# Patient Record
Sex: Female | Born: 1945
Health system: Southern US, Community
[De-identification: ages and names within clinical notes are randomized; demographics above are authoritative.]

## PROBLEM LIST (undated history)

## (undated) DIAGNOSIS — M1990 Unspecified osteoarthritis, unspecified site: Secondary | ICD-10-CM

## (undated) DIAGNOSIS — J309 Allergic rhinitis, unspecified: Secondary | ICD-10-CM

## (undated) DIAGNOSIS — N6019 Diffuse cystic mastopathy of unspecified breast: Secondary | ICD-10-CM

## (undated) DIAGNOSIS — N200 Calculus of kidney: Secondary | ICD-10-CM

## (undated) DIAGNOSIS — H269 Unspecified cataract: Secondary | ICD-10-CM

## (undated) DIAGNOSIS — M8580 Other specified disorders of bone density and structure, unspecified site: Secondary | ICD-10-CM

## (undated) DIAGNOSIS — N870 Mild cervical dysplasia: Secondary | ICD-10-CM

## (undated) DIAGNOSIS — T7840XA Allergy, unspecified, initial encounter: Secondary | ICD-10-CM

## (undated) DIAGNOSIS — H9313 Tinnitus, bilateral: Secondary | ICD-10-CM

## (undated) DIAGNOSIS — Z8619 Personal history of other infectious and parasitic diseases: Secondary | ICD-10-CM

## (undated) DIAGNOSIS — I959 Hypotension, unspecified: Secondary | ICD-10-CM

## (undated) HISTORY — PX: CERVICAL CONE BIOPSY: SUR198

## (undated) HISTORY — PX: BREAST CYST ASPIRATION: SHX578

## (undated) HISTORY — DX: Allergy, unspecified, initial encounter: T78.40XA

## (undated) HISTORY — DX: Unspecified cataract: H26.9

## (undated) HISTORY — DX: Diffuse cystic mastopathy of unspecified breast: N60.19

## (undated) HISTORY — PX: BREAST CYST EXCISION: SHX579

## (undated) HISTORY — DX: Other specified disorders of bone density and structure, unspecified site: M85.80

## (undated) HISTORY — DX: Calculus of kidney: N20.0

## (undated) HISTORY — PX: BREAST BIOPSY: SHX20

## (undated) HISTORY — DX: Personal history of other infectious and parasitic diseases: Z86.19

## (undated) HISTORY — DX: Hypotension, unspecified: I95.9

## (undated) HISTORY — DX: Unspecified osteoarthritis, unspecified site: M19.90

## (undated) HISTORY — PX: COLPOSCOPY: SHX161

## (undated) HISTORY — PX: TUBAL LIGATION: SHX77

## (undated) HISTORY — PX: COLONOSCOPY: SHX174

## (undated) HISTORY — PX: LAPAROSCOPIC TUBAL LIGATION: SUR803

## (undated) HISTORY — DX: Mild cervical dysplasia: N87.0

## (undated) HISTORY — PX: OTHER SURGICAL HISTORY: SHX169

## (undated) HISTORY — DX: Tinnitus, bilateral: H93.13

## (undated) HISTORY — DX: Allergic rhinitis, unspecified: J30.9

---

## 1997-12-19 ENCOUNTER — Other Ambulatory Visit: Admission: RE | Admit: 1997-12-19 | Discharge: 1997-12-19 | Payer: Self-pay | Admitting: Obstetrics and Gynecology

## 1997-12-20 ENCOUNTER — Other Ambulatory Visit: Admission: RE | Admit: 1997-12-20 | Discharge: 1997-12-20 | Payer: Self-pay | Admitting: Obstetrics and Gynecology

## 1998-11-26 ENCOUNTER — Other Ambulatory Visit: Admission: RE | Admit: 1998-11-26 | Discharge: 1998-11-26 | Payer: Self-pay | Admitting: Obstetrics and Gynecology

## 1999-01-09 ENCOUNTER — Other Ambulatory Visit: Admission: RE | Admit: 1999-01-09 | Discharge: 1999-01-09 | Payer: Self-pay | Admitting: Obstetrics and Gynecology

## 1999-01-09 ENCOUNTER — Encounter (INDEPENDENT_AMBULATORY_CARE_PROVIDER_SITE_OTHER): Payer: Self-pay

## 1999-08-03 ENCOUNTER — Encounter: Payer: Self-pay | Admitting: Obstetrics and Gynecology

## 1999-08-03 ENCOUNTER — Encounter: Admission: RE | Admit: 1999-08-03 | Discharge: 1999-08-03 | Payer: Self-pay | Admitting: Obstetrics and Gynecology

## 1999-11-23 ENCOUNTER — Other Ambulatory Visit: Admission: RE | Admit: 1999-11-23 | Discharge: 1999-11-23 | Payer: Self-pay | Admitting: Obstetrics and Gynecology

## 1999-12-22 ENCOUNTER — Other Ambulatory Visit: Admission: RE | Admit: 1999-12-22 | Discharge: 1999-12-22 | Payer: Self-pay | Admitting: Obstetrics and Gynecology

## 2000-08-04 ENCOUNTER — Encounter: Payer: Self-pay | Admitting: Internal Medicine

## 2000-08-04 LAB — HM COLONOSCOPY

## 2000-08-08 ENCOUNTER — Encounter: Payer: Self-pay | Admitting: Obstetrics and Gynecology

## 2000-08-08 ENCOUNTER — Encounter: Admission: RE | Admit: 2000-08-08 | Discharge: 2000-08-08 | Payer: Self-pay | Admitting: Obstetrics and Gynecology

## 2000-11-24 ENCOUNTER — Other Ambulatory Visit: Admission: RE | Admit: 2000-11-24 | Discharge: 2000-11-24 | Payer: Self-pay | Admitting: Obstetrics and Gynecology

## 2000-12-01 ENCOUNTER — Encounter: Payer: Self-pay | Admitting: Obstetrics and Gynecology

## 2000-12-01 ENCOUNTER — Encounter: Admission: RE | Admit: 2000-12-01 | Discharge: 2000-12-01 | Payer: Self-pay | Admitting: Obstetrics and Gynecology

## 2001-10-16 ENCOUNTER — Encounter: Payer: Self-pay | Admitting: Obstetrics and Gynecology

## 2001-10-16 ENCOUNTER — Encounter: Admission: RE | Admit: 2001-10-16 | Discharge: 2001-10-16 | Payer: Self-pay | Admitting: Obstetrics and Gynecology

## 2001-11-29 ENCOUNTER — Other Ambulatory Visit: Admission: RE | Admit: 2001-11-29 | Discharge: 2001-11-29 | Payer: Self-pay | Admitting: Obstetrics and Gynecology

## 2002-10-18 ENCOUNTER — Encounter: Payer: Self-pay | Admitting: Obstetrics and Gynecology

## 2002-10-18 ENCOUNTER — Encounter: Admission: RE | Admit: 2002-10-18 | Discharge: 2002-10-18 | Payer: Self-pay | Admitting: Obstetrics and Gynecology

## 2003-01-09 ENCOUNTER — Other Ambulatory Visit: Admission: RE | Admit: 2003-01-09 | Discharge: 2003-01-09 | Payer: Self-pay | Admitting: Obstetrics and Gynecology

## 2003-02-28 ENCOUNTER — Encounter: Admission: RE | Admit: 2003-02-28 | Discharge: 2003-02-28 | Payer: Self-pay | Admitting: Obstetrics and Gynecology

## 2003-10-30 ENCOUNTER — Ambulatory Visit (HOSPITAL_COMMUNITY): Admission: RE | Admit: 2003-10-30 | Discharge: 2003-10-30 | Payer: Self-pay | Admitting: Obstetrics and Gynecology

## 2004-01-14 ENCOUNTER — Other Ambulatory Visit: Admission: RE | Admit: 2004-01-14 | Discharge: 2004-01-14 | Payer: Self-pay | Admitting: Obstetrics and Gynecology

## 2004-11-02 ENCOUNTER — Ambulatory Visit (HOSPITAL_COMMUNITY): Admission: RE | Admit: 2004-11-02 | Discharge: 2004-11-02 | Payer: Self-pay | Admitting: Obstetrics and Gynecology

## 2004-11-09 ENCOUNTER — Encounter: Admission: RE | Admit: 2004-11-09 | Discharge: 2004-11-09 | Payer: Self-pay | Admitting: Obstetrics and Gynecology

## 2005-01-19 ENCOUNTER — Other Ambulatory Visit: Admission: RE | Admit: 2005-01-19 | Discharge: 2005-01-19 | Payer: Self-pay | Admitting: Obstetrics and Gynecology

## 2005-03-23 ENCOUNTER — Ambulatory Visit (HOSPITAL_BASED_OUTPATIENT_CLINIC_OR_DEPARTMENT_OTHER): Admission: RE | Admit: 2005-03-23 | Discharge: 2005-03-23 | Payer: Self-pay | Admitting: Orthopedic Surgery

## 2005-11-04 ENCOUNTER — Ambulatory Visit (HOSPITAL_COMMUNITY): Admission: RE | Admit: 2005-11-04 | Discharge: 2005-11-04 | Payer: Self-pay | Admitting: Obstetrics and Gynecology

## 2006-01-20 ENCOUNTER — Other Ambulatory Visit: Admission: RE | Admit: 2006-01-20 | Discharge: 2006-01-20 | Payer: Self-pay | Admitting: Obstetrics and Gynecology

## 2006-11-15 ENCOUNTER — Ambulatory Visit (HOSPITAL_COMMUNITY): Admission: RE | Admit: 2006-11-15 | Discharge: 2006-11-15 | Payer: Self-pay | Admitting: Obstetrics and Gynecology

## 2007-01-24 ENCOUNTER — Other Ambulatory Visit: Admission: RE | Admit: 2007-01-24 | Discharge: 2007-01-24 | Payer: Self-pay | Admitting: Obstetrics and Gynecology

## 2007-01-24 LAB — CONVERTED CEMR LAB

## 2007-03-06 ENCOUNTER — Encounter: Payer: Self-pay | Admitting: Internal Medicine

## 2007-03-08 ENCOUNTER — Encounter: Payer: Self-pay | Admitting: Internal Medicine

## 2007-08-07 ENCOUNTER — Telehealth: Payer: Self-pay | Admitting: Internal Medicine

## 2007-11-07 ENCOUNTER — Ambulatory Visit: Payer: Self-pay | Admitting: Internal Medicine

## 2007-11-07 DIAGNOSIS — N6019 Diffuse cystic mastopathy of unspecified breast: Secondary | ICD-10-CM

## 2007-11-07 DIAGNOSIS — R87619 Unspecified abnormal cytological findings in specimens from cervix uteri: Secondary | ICD-10-CM | POA: Insufficient documentation

## 2007-11-07 DIAGNOSIS — J309 Allergic rhinitis, unspecified: Secondary | ICD-10-CM | POA: Insufficient documentation

## 2007-11-28 ENCOUNTER — Ambulatory Visit (HOSPITAL_COMMUNITY): Admission: RE | Admit: 2007-11-28 | Discharge: 2007-11-28 | Payer: Self-pay | Admitting: Obstetrics and Gynecology

## 2007-12-05 ENCOUNTER — Encounter: Admission: RE | Admit: 2007-12-05 | Discharge: 2007-12-05 | Payer: Self-pay | Admitting: Obstetrics and Gynecology

## 2008-01-26 ENCOUNTER — Encounter: Payer: Self-pay | Admitting: Obstetrics and Gynecology

## 2008-01-26 ENCOUNTER — Ambulatory Visit: Payer: Self-pay | Admitting: Obstetrics and Gynecology

## 2008-01-26 ENCOUNTER — Other Ambulatory Visit: Admission: RE | Admit: 2008-01-26 | Discharge: 2008-01-26 | Payer: Self-pay | Admitting: Obstetrics and Gynecology

## 2008-08-21 ENCOUNTER — Encounter: Payer: Self-pay | Admitting: Internal Medicine

## 2008-12-05 ENCOUNTER — Ambulatory Visit (HOSPITAL_COMMUNITY): Admission: RE | Admit: 2008-12-05 | Discharge: 2008-12-05 | Payer: Self-pay | Admitting: Obstetrics and Gynecology

## 2008-12-12 ENCOUNTER — Encounter: Admission: RE | Admit: 2008-12-12 | Discharge: 2008-12-12 | Payer: Self-pay | Admitting: Obstetrics and Gynecology

## 2009-01-28 ENCOUNTER — Ambulatory Visit: Payer: Self-pay | Admitting: Obstetrics and Gynecology

## 2009-01-28 ENCOUNTER — Other Ambulatory Visit: Admission: RE | Admit: 2009-01-28 | Discharge: 2009-01-28 | Payer: Self-pay | Admitting: Obstetrics and Gynecology

## 2009-03-11 ENCOUNTER — Ambulatory Visit: Payer: Self-pay | Admitting: Obstetrics and Gynecology

## 2009-09-01 ENCOUNTER — Ambulatory Visit: Payer: Self-pay | Admitting: Internal Medicine

## 2009-12-15 ENCOUNTER — Ambulatory Visit (HOSPITAL_COMMUNITY): Admission: RE | Admit: 2009-12-15 | Discharge: 2009-12-15 | Payer: Self-pay | Admitting: Obstetrics and Gynecology

## 2009-12-15 LAB — HM MAMMOGRAPHY: HM Mammogram: NEGATIVE

## 2010-01-30 ENCOUNTER — Other Ambulatory Visit
Admission: RE | Admit: 2010-01-30 | Discharge: 2010-01-30 | Payer: Self-pay | Source: Home / Self Care | Admitting: Obstetrics and Gynecology

## 2010-01-30 ENCOUNTER — Ambulatory Visit: Payer: Self-pay | Admitting: Obstetrics and Gynecology

## 2010-03-14 ENCOUNTER — Encounter: Payer: Self-pay | Admitting: Obstetrics and Gynecology

## 2010-03-24 ENCOUNTER — Telehealth: Payer: Self-pay | Admitting: Internal Medicine

## 2010-03-26 NOTE — Assessment & Plan Note (Signed)
Summary: [name] .Cynthia KitchenMarland Miller   Vital Signs:  Patient profile:   65 year old female Height:      62.5 inches Weight:      121.75 pounds BMI:     21.99 O2 Sat:      97 % on Room air Temp:     98.3 degrees F oral Pulse rate:   84 / minute Pulse rhythm:   regular BP sitting:   108 / 70  (left arm) Cuff size:   regular  Vitals Entered By: Rock Nephew CMA (September 01, 2009 1:25 PM)  O2 Flow:  Room air  Primary Care Provider:  Laelah Miller   History of Present Illness: Patient presents requesting zostavax. She was last seen in '09. she reports that she is healthy and doing well. She is current with her gynecologist.  Current Medications (verified): 1)  Boniva 150 Mg Tabs (Ibandronate Sodium) .... Take 1 Tablet By Mouth Once A Month 2)  Caltrate 600+d 600-400 Mg-Unit Tabs (Calcium Carbonate-Vitamin D) .... Take 1 Tablet By Mouth Two Times A Day 3)  Multivitamins   Tabs (Multiple Vitamin) .... Take 1 Tablet By Mouth Once A Day 4)  Vitamin B-12 1000 Mcg Tabs (Cyanocobalamin) .... Take 1 Tablet By Mouth Once A Day 5)  Claritin-D 24 Hour 10-240 Mg Xr24h-Tab (Loratadine-Pseudoephedrine) .... Take 1 Tablet By Mouth Once A Day  Allergies (verified): 1)  ! Pcn  Past History:  Past Medical History: Last updated: December 03, 2007 UCD ALLERGIC RHINITIS (ICD-477.9) FIBROCYSTIC BREAST DISEASE (ICD-610.1) Hx of PAP SMEAR, ABNORMAL (ICD-795.00)    Physician Roster:              Gyn - Dr. Basilio Cairo - Dr. Lestine Box              allergy - Dr. Ennis Forts - Drs. 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  Risk Factors: Alcohol Use: <1 (12/03/07) Caffeine Use: 0 (12-03-2007) Exercise: yes (2007-12-03)  Risk Factors: Smoking Status: never (2007-12-03)  Review of Systems       has no complaints - did not do detailed review.  Physical Exam  General:  alert, well-developed, well-nourished, and well-hydrated.   Lungs:  normal respiratory effort.   Heart:  normal rate and regular rhythm.   Neurologic:  alert & oriented X3 and gait normal.   Skin:  color normal.   Psych:  normally interactive and good eye contact.     Impression & Recommendations:  Problem # 1:  Preventive Health Care (ICD-V70.0) presents for immunization. Has no complaints. Reports being current with Gyn. Last colonoscopy '02 will be due in 2012; last mammogram October '10. Had tetnus and pneumonia vaccine in '09. Zostavax today.  In summary - a patient who presents requesting shingles vaccine. Old records fully reviewed. She has no  complaints. Recommend general physical exam and full labs in 2012 (3 year interval). She will otherwise return as needed.  Complete Medication List: 1)  Boniva 150 Mg Tabs (Ibandronate sodium) .... Take 1 tablet by mouth once a month 2)  Caltrate 600+d 600-400 Mg-unit Tabs (Calcium carbonate-vitamin d) .... Take 1 tablet by mouth two times a day 3)  Multivitamins Tabs (Multiple vitamin) .... Take 1 tablet by mouth once a day 4)  Vitamin B-12 1000 Mcg Tabs (Cyanocobalamin) .... Take 1 tablet by mouth once a day 5)  Claritin-d 24 Hour 10-240 Mg Xr24h-tab (Loratadine-pseudoephedrine) .... Take 1 tablet by mouth once a day  Other Orders: Zoster (Shingles) Vaccine Live [id number]) Admin 1st Vaccine (98119)    Immunizations Administered:  Zostavax # 1:     Vaccine Type: Zostavax    Site: left deltoid    Mfr: Merck    Dose: 0.5 ml    Route: IM    Given by: Rock Nephew CMA    Exp. Date: 08/01/2010    Lot #: [id number]    VIS given: 12/04/04 given September 01, 2009.

## 2010-03-27 ENCOUNTER — Encounter: Payer: Self-pay | Admitting: Internal Medicine

## 2010-04-01 NOTE — Procedures (Signed)
Summary: colonoscopy  Patient Name: Cynthia, Miller MRN:  Procedure Procedures: Colonoscopy CPT: 03474.  Personnel: Endoscopist: Shaneka Efaw L. Juanda Chance, MD.  Referred By: Rande Brunt Eda Paschal, M.D.  Exam Location: Exam performed in Outpatient Clinic. Outpatient  Patient Consent: Procedure, Alternatives, Risks and Benefits discussed, consent obtained, from patient.  Indications Symptoms: screening.  Average Risk Screening Routine.  History  Current Medications: Patient is taking an non-steroidal medication.  Pre-Exam Physical: Performed Aug 04, 2000. Cardio-pulmonary exam, Rectal exam, HEENT exam , Abdominal exam, Extremity exam, Neurological exam, Mental status exam WNL.  Exam Exam: Extent of exam reached: Cecum, extent intended: Cecum.  Colon retroflexion not performed. Images taken. ASA Classification: I. Tolerance: excellent.  Monitoring: Pulse and BP monitoring, Oximetry used. Supplemental O2 given.  Colon Prep Used Golytely for colon prep. Prep results: excellent.  Sedation Meds: Patient assessed and found to be appropriate for moderate (conscious) sedation. Fentanyl 100 mcg. Versed 10 mg.  Findings NORMAL EXAM: Cecum.   Assessment Normal examination.  Comments: no polyps Events  Unplanned Interventions: No intervention was required.  Unplanned Events: There were no complications. Plans Patient Education: Patient given standard instructions for: Yearly hemoccult testing recommended. Patient instructed to get routine colonoscopy every 10 years.  Comments: high fiber diet Disposition: After procedure patient sent to recovery. After recovery patient sent home.  Scheduling/Referral: Follow-Up prn.    cc: Reuel Boom L. Eda Paschal, MD This report was created from the original endoscopy report, which was reviewed and signed by the above listed endoscopist.

## 2010-04-01 NOTE — Letter (Signed)
Summary: Office Visit Letter  [facility]  [address] [place], [place] [address]   Phone: [phone]  Fax: [phone]      March 27, 2010 MRN: [id number]   [facility] [address] [place], [place]  [address]   Dear Ms. Milbourne,   You have been scheduled for a new patient visit with Dr. Marina Goodell on 04/30/10 at 9:30 AM. Please, bring your insurance cards,any copays and a medication list with you to the appointment. Failure to cancel an appointment 24 hours prior to visit may result in a cancellation fee.    If you have any questions, concerns, or feel that this letter is in error, we would appreciate your call at [phone].   Sincerely,     [facility] [phone]

## 2010-04-01 NOTE — Progress Notes (Signed)
Summary: switch drs  Phone Note Call from Patient Call back at Home Phone [phone]   Caller: Patient Call For: Dr Juanda Chance Reason for Call: Talk to Nurse Summary of Call: Patient would like to switch from Dr Juanda Chance to Dr Marina Goodell because Dr Eda Paschal suggested Dr Marina Goodell to her and because she was told that Dr Juanda Chance might be retiring soon. Initial call taken by: Tawni Levy,  March 24, 2010 9:29 AM  Follow-up for Phone Call        Message left for patient to call back.Jesse Fall RN  March 24, 2010 1:12 PM Spoke with patient. She was in to see Dr. Eda Paschal recently and he told her her figured Dr. Juanda Chance would be retiring soon since her husband had retired. He told pt. he saw Dr. Marina Goodell and liked him. Patient wants to change to Dr. Marina Goodell. She states she is due for a colonoscopy in 07/2010 and would like to do it earlier if possible. Last colon 04/09/00- normal. Please, advise Follow-up by: Jesse Fall RN,  March 24, 2010 3:45 PM  Additional Follow-up for Phone Call Additional follow up Details #1::        That's interesting. She knows something I don't. Additional Follow-up by: Hart Carwin MD,  March 25, 2010 2:26 PM    Additional Follow-up for Phone Call Additional follow up Details #2::    Message left for patient to call back.Jesse Fall RN  March 26, 2010 3:10 PM Patient does want to pursue changing to Dr. Marina Goodell. Is this ok? Follow-up by: Jesse Fall RN,  March 27, 2010 9:50 AM  Additional Follow-up for Phone Call Additional follow up Details #3:: Details for Additional Follow-up Action Taken: It's totally up to Dr Juanda Chance. I don't care one way or the other. Hilarie Fredrickson MD  March 27, 2010 2:15 PM      OK. Additional Follow-up by: Hart Carwin MD,  March 27, 2010 1:50 PM   Appended Document: switch drs Scheduled patient for new patient visit with Dr. Marina Goodell on  04/30/10 at 9:30 AM. Left message with her family for her to call me.  Appended  Document: switch drs Spoke with patient and gave her the appointment. Also, mailed her a letter with appt. time.

## 2010-04-30 ENCOUNTER — Encounter: Payer: Self-pay | Admitting: Internal Medicine

## 2010-04-30 ENCOUNTER — Ambulatory Visit (INDEPENDENT_AMBULATORY_CARE_PROVIDER_SITE_OTHER): Payer: Medicare Other | Admitting: Internal Medicine

## 2010-04-30 DIAGNOSIS — Z1211 Encounter for screening for malignant neoplasm of colon: Secondary | ICD-10-CM

## 2010-05-04 ENCOUNTER — Ambulatory Visit (INDEPENDENT_AMBULATORY_CARE_PROVIDER_SITE_OTHER): Payer: Medicare Other | Admitting: Obstetrics and Gynecology

## 2010-05-04 DIAGNOSIS — R221 Localized swelling, mass and lump, neck: Secondary | ICD-10-CM

## 2010-05-05 ENCOUNTER — Other Ambulatory Visit: Payer: Self-pay | Admitting: Obstetrics and Gynecology

## 2010-05-05 DIAGNOSIS — R609 Edema, unspecified: Secondary | ICD-10-CM

## 2010-05-05 NOTE — Assessment & Plan Note (Signed)
Summary:  Screening colonoscopy   History of Present Illness Visit Type: Follow-up Visit Primary GI MD: Yancey Flemings MD Primary Provider: Jacques Navy, MD  Requesting Provider: na Chief Complaint: Consult colon. Pt denies any GI complaints  History of Present Illness:    healthy 65 year old female who presents today regarding routine colorectal neoplasia screening. She underwent index screening colonoscopy June 2002 with Dr. Juanda Chance. Examination was normal. Followup in 10 years recommended. Patient requested that I perform her followup exam. Mutually  agreed. Her GI review of systems is negative. No interval development of colon cancer in her family. No problems or concerns related to her initial procedure.   GI Review of Systems      Denies abdominal pain, acid reflux, belching, bloating, chest pain, dysphagia with liquids, dysphagia with solids, heartburn, loss of appetite, nausea, vomiting, vomiting blood, weight loss, and  weight gain.        Denies anal fissure, black tarry stools, change in bowel habit, constipation, diarrhea, diverticulosis, fecal incontinence, heme positive stool, hemorrhoids, irritable bowel syndrome, jaundice, light color stool, liver problems, rectal bleeding, and  rectal pain.    Current Medications (verified): 1)  Caltrate 600+d 600-400 Mg-Unit Tabs (Calcium Carbonate-Vitamin D) .... Take 1 Tablet By Mouth Two Times A Day 2)  One A Day Multi Vitamin Over Age 24 .... One Tablet By Mouth Once Daily 3)  Claritin-D 24 Hour 10-240 Mg Xr24h-Tab (Loratadine-Pseudoephedrine) .... Take 1 Tablet By Mouth Once A Day 4)  Omega-3 Fish Oil 1000 Mg Caps (Omega-3 Fatty Acids) .... Takes 1200mg  By Mouth Once Daily  Allergies (verified): 1)  ! Pcn  Past History:  Past Medical History: Reviewed history from 11/07/2007 and no changes required. UCD ALLERGIC RHINITIS (ICD-477.9) FIBROCYSTIC BREAST DISEASE (ICD-610.1) Hx of PAP SMEAR, ABNORMAL  (ICD-795.00)    Physician Roster:              Gyn - Dr. Basilio Cairo - Dr. Lestine Box              allergy - Dr. Ennis Forts - Drs. Virgina Norfolk  Past Surgical History: Reviewed history from 11/07/2007 and no changes required. breast biopsy '65 foot surgery - right 1st MCP for post-traumatic joint deformity '06 Lestine Box)  Family History: Reviewed history from 11/07/2007 and no changes required. Father - deceased @ 41s: Lung cancer (smoker) Mother - 10: Independent, lipids, thyroid disease Neg-breast or colon cancer; DM; CAD  Social History: Reviewed history from 11/07/2007 and no changes required. 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  Review of Systems       The patient complains of allergy/sinus.  The patient denies anemia, anxiety-new, arthritis/joint pain, back pain, blood in urine, breast changes/lumps, change in vision, confusion, cough, coughing up blood, depression-new, fainting, fatigue, fever, headaches-new, hearing problems, heart murmur, heart rhythm changes, itching, menstrual pain, muscle pains/cramps, night sweats, nosebleeds, pregnancy symptoms, shortness of breath, skin rash, sleeping problems, sore throat, swelling of feet/legs, swollen lymph glands, thirst - excessive , urination - excessive , urination changes/pain, urine leakage, vision changes, and voice change.    Vital Signs:  Patient profile:   65 year old female Height:  62.5 inches Weight:      123 pounds BMI:     22.22 BSA:     1.57 Pulse rate:   88 / minute Pulse rhythm:   regular BP sitting:   118 / 64  (left arm) Cuff size:   regular  Vitals Entered By: Ok Anis CMA (April 30, 2010 9:40 AM)  Physical Exam  General:  Well developed, well nourished, no acute distress. Head:   Normocephalic and atraumatic. Eyes:  PERRLA, no icterus. Mouth:  No deformity or lesions, dentition normal. Neck:  Supple; no masses or thyromegaly. Lungs:  Clear throughout to auscultation. Heart:  Regular rate and rhythm; no murmurs, rubs,  or bruits. Abdomen:  Soft, nontender and nondistended. No masses, hepatosplenomegaly or hernias noted. Normal bowel sounds. Msk:  Symmetrical with no gross deformities. Normal posture. Pulses:  Normal pulses noted. Extremities:   no edema Neurologic:  Alert and  oriented x4; Skin:  Intact without significant lesions or rashes. Psych:  Alert and cooperative. Normal mood and affect.   Impression & Recommendations:  Problem # 1:  SCREENING, COLON CANCER (ICD-V1.87)  65 year old female with no significant past medical history. Presents today regarding screening colonoscopy. Negative index exam 10 years ago. she is an appropriate candidate without contraindication. the nature of the procedure as well as the risks, benefits, and alternatives were reviewed. she understood and agreed to proceed. movi prep to be prescribed. the patient will be instructed on this use  Patient Instructions: 1)  Call our office in 2 weeks and schedule your colonoscopy.  Our schedule for end of April and May should be available.  We will schedule you for your procedure and to come in for a pre-visit with our nurse to go over instructions with you.   2)  Copy sent to : Illene Regulus, MD  Edyth Gunnels, MD 3)  The medication list was reviewed and reconciled.  All changed / newly prescribed medications were explained.  A complete medication list was provided to the patient / caregiver.

## 2010-05-06 ENCOUNTER — Ambulatory Visit
Admission: RE | Admit: 2010-05-06 | Discharge: 2010-05-06 | Disposition: A | Discharge status: Home / Self Care | Payer: Medicare Other | Source: Ambulatory Visit | Attending: Obstetrics and Gynecology | Admitting: Obstetrics and Gynecology

## 2010-05-06 DIAGNOSIS — R609 Edema, unspecified: Secondary | ICD-10-CM

## 2010-07-10 NOTE — Op Note (Signed)
[name], Cynthia Miller               ACCOUNT NO.:  [id number]   MEDICAL RECORD NO.:  [id number]          PATIENT TYPE:  AMB   LOCATION:  [facility]                          FACILITY:  [facility]   PHYSICIAN:  Leonides Grills, M.D.     DATE OF BIRTH:  1945/09/01   DATE OF PROCEDURE:  03/23/2005  DATE OF DISCHARGE:                                 OPERATIVE REPORT   PREOPERATIVE DIAGNOSIS:  Right hallux rigidus.   POSTOPERATIVE DIAGNOSIS:  Right hallux rigidus.   OPERATION PERFORMED:  Right great toe cheilectomy.   SURGEON:  Leonides Grills, M.D.   ASSISTANT:  Lianne Cure, P.A.   ANESTHESIA:  General.   ESTIMATED BLOOD LOSS:  Minimal.   TOURNIQUET TIME:  Approximately half hour.   COMPLICATIONS:  None.   DISPOSITION:  Stable to PR.   INDICATIONS FOR PROCEDURE:  The patient is a 65 year old female who has had  longstanding dorsal right great toe pain that was interfering with her life  to the point where she cannot do what she wants to do. The patient  has  consented for the above procedure.  All risks which include infection,  neurovascular injury, persistent pain, worsening pain, prolonged recovery,  stiffness, arthritis and possible future fusion were all explained,  questions were encouraged and answered.   DESCRIPTION OF PROCEDURE:  The patient was brought to the operating room and  placed in supine position after adequate general endotracheal tube  anesthesia was administered with block as well as Ancef 1 g IV piggyback.  The right lower extremity was then prepped and draped in sterile manner over  a proximally placed thigh tourniquet.  The limb was gravity exsanguinated  and tourniquet was elevated to 290 mmHg.  A longitudinal incision over the  dorsal aspect of the left great toe MTP joint was then made.  Dissection was  carried out through the skin. Hemostasis was obtained.  A capsulotomy was  made just medial to the EHL tendon that was retracted out of harm's way and  protected  throughout the case.  There was a large dorsal osteophyte on the  dorsal aspect of the first metatarsal head.  This was carefully dissected  out both medially and laterally.  Then with a sagittal saw, this was then  removed, removing the top third to quarter of the left metatarsal head and  spur.  We then entered medial and lateral gutters and removed the  osteophytes as well as loose bodies in this area as well.  We then removed  the dorsal osteophyte on the base of the proximal phalanx as well with the  rongeur.  The toe was ranged and had outstanding range of motion with no  crepitus. The remaining cartilage looked excellent.  The joint and area were  copiously  irrigated with normal saline.  Bone wax was applied to bony surfaces.  Capsule was closed with 3-0 Vicryl.  Tourniquet was deflated, hemostasis was  obtained.  Capsule was closed with 4-0 nylon.  Sterile dressing was applied.  Hard sole shoe was applied.  The patient was stable to the PR.  Leonides Grills, M.D.  Electronically Signed     PB/MEDQ  D:  03/23/2005  T:  03/23/2005  Job:  [id number]

## 2010-07-13 ENCOUNTER — Ambulatory Visit (AMBULATORY_SURGERY_CENTER): Payer: Medicare Other | Admitting: *Deleted

## 2010-07-13 VITALS — Ht 63.0 in | Wt 124.3 lb

## 2010-07-13 DIAGNOSIS — Z1211 Encounter for screening for malignant neoplasm of colon: Secondary | ICD-10-CM

## 2010-07-13 MED ORDER — PEG-KCL-NACL-NASULF-NA ASC-C 100 G PO SOLR: ORAL | Status: DC

## 2010-07-22 ENCOUNTER — Encounter: Payer: Self-pay | Admitting: Internal Medicine

## 2010-07-23 ENCOUNTER — Ambulatory Visit (AMBULATORY_SURGERY_CENTER): Payer: Medicare Other | Admitting: Internal Medicine

## 2010-07-23 ENCOUNTER — Encounter: Payer: Self-pay | Admitting: Internal Medicine

## 2010-07-23 VITALS — BP 134/76 | HR 81 | Temp 97.4°F | Resp 19 | Ht 63.0 in | Wt 124.0 lb

## 2010-07-23 DIAGNOSIS — Z1211 Encounter for screening for malignant neoplasm of colon: Secondary | ICD-10-CM

## 2010-07-23 DIAGNOSIS — D126 Benign neoplasm of colon, unspecified: Secondary | ICD-10-CM

## 2010-07-23 MED ORDER — SODIUM CHLORIDE 0.9 % IV SOLN: 500.0000 mL | INTRAVENOUS | Status: DC

## 2010-07-23 NOTE — Patient Instructions (Addendum)
Green and blue discharge instructions reviewed with patient and care partner.  Blue discharge instructions signed by care partner.  Impressions/Recommendations;  Polyp (handout given)  Repeat colonoscopy in 5-10 years.  Continue medications as previously taken prior to procedure.

## 2010-07-23 NOTE — Progress Notes (Signed)
Pt pale and diaphoretic with mild nausea upon getting up and getting dressed. Laid back down bp 130/71, hr 69, cool cloth applied to neck and forehead. Pt abdomen still a bit distended, placed onto right lat decub and trendelenburg...with passage of some air. Dr. Marina Goodell in and aware.  1412: Pt color improved. Raising head of bed slowly and allowing pt to move about. 1415: Pt still very groggy. Moved to holding area with recliners to let medicine wear off a bit more.

## 2010-07-24 ENCOUNTER — Telehealth: Payer: Self-pay

## 2010-07-24 NOTE — Telephone Encounter (Signed)

## 2010-07-29 ENCOUNTER — Encounter: Payer: Self-pay | Admitting: Internal Medicine

## 2010-10-02 ENCOUNTER — Ambulatory Visit (INDEPENDENT_AMBULATORY_CARE_PROVIDER_SITE_OTHER): Payer: Medicare Other | Admitting: Internal Medicine

## 2010-10-02 ENCOUNTER — Encounter: Payer: Self-pay | Admitting: Internal Medicine

## 2010-10-02 VITALS — BP 110/72 | HR 82 | Temp 98.7°F | Ht 62.5 in

## 2010-10-02 DIAGNOSIS — J208 Acute bronchitis due to other specified organisms: Secondary | ICD-10-CM

## 2010-10-02 DIAGNOSIS — J209 Acute bronchitis, unspecified: Secondary | ICD-10-CM

## 2010-10-02 MED ORDER — CHLORPHENIRAMINE-HYDROCODONE 8-10 MG/5ML PO LQCR: 5.0000 mL | Freq: Two times a day (BID) | ORAL | Status: AC | PRN

## 2010-10-02 NOTE — Patient Instructions (Signed)
It was good to see you today. If you develop worsening symptoms or fever, call and we can reconsider antibiotics, but it does not appear necessary to use antibiotics at this time. Tussionex cough syrup as needed - Your prescription(s) have been submitted to your pharmacy. Please take as directed and contact our office if you believe you are having problem(s) with the medication(s). Can also try Mucinex DM for cough control

## 2010-10-02 NOTE — Progress Notes (Signed)
  Subjective:    Patient ID: Cynthia Miller, female    DOB: 04/08/1945, 65 y.o.   MRN: [id number]  HPI  complains of chest congestion Onset 1 week ago associated with cough, esp bedtime -> productive of thick yellow sputum initially, now improved Also associated with fatigue and hoarsness Denies fever, sore throat, headache or night sweats symptoms have gradually improved since onset Not using any OTC meds No sick contacts Taking claritin for allergy/sinus symptoms   Past Medical History  Diagnosis Date  . Low blood pressure   . Osteoarthritis     THUMB  . ALLERGIC RHINITIS   . FIBROCYSTIC BREAST DISEASE     Review of Systems  Constitutional: Positive for fatigue. Negative for fever, chills, diaphoresis and unexpected weight change.  HENT: Positive for postnasal drip. Negative for neck pain.   Respiratory: Negative for shortness of breath and wheezing.   Cardiovascular: Negative for leg swelling.  Neurological: Negative for headaches.       Objective:   Physical Exam BP 110/72  Pulse 82  Temp(Src) 98.7 F (37.1 C) (Oral)  Ht 5' 2.5" (1.588 m)  SpO2 94% Constitutional: She is appears well-developed and well-nourished but mildly ill - hoarse voice quality HENT: Head: Normocephalic and atraumatic. Ears:L TMs ok,R TM with clear serous effusion- no erythema; Nose: Nose normal.  Mouth/Throat: Oropharynx is moderately erythematous; +yellow PND. No oropharyngeal exudate.  Eyes: Conjunctivae and EOM are normal. Pupils are equal, round, and reactive to light. No scleral icterus.  Neck: Normal range of motion. Neck supple. No JVD present. No thyromegaly present.  Cardiovascular: Normal rate, regular rhythm and normal heart sounds.  No murmur heard. No BLE edema. Pulmonary/Chest: Effort normal and breath sounds normal. No respiratory distress. She has no wheezes.  Psychiatric: She has a normal mood and affect. Her behavior is normal. Judgment and thought content normal.   No  results found for this basename: WBC, HGB, HCT, PLT, CHOL, TRIG, HDL, LDLDIRECT, ALT, AST, NA, K, CL, CREATININE, BUN, CO2, TSH, PSA, INR, GLUF, HGBA1C, MICROALBUR      Assessment & Plan:  Acute bronchitis, suspect viral - gradually improving symptoms - no fever but persisting cough -  Will hold antibiotics at this time but pt will call if symptoms worse symptoms relief with tussionex (rx done) or OTC meds advised Continue claritin as ongoing

## 2010-10-05 ENCOUNTER — Telehealth: Payer: Self-pay

## 2010-10-05 MED ORDER — AZITHROMYCIN 250 MG PO TABS: ORAL_TABLET | ORAL | Status: DC

## 2010-10-05 NOTE — Telephone Encounter (Signed)
zpak done - thanks

## 2010-10-05 NOTE — Telephone Encounter (Signed)
Addended by: Rene Paci A on: 10/05/2010 11:50 AM   Modules accepted: Orders

## 2010-10-05 NOTE — Telephone Encounter (Signed)
Duplicate note - see addendum on prior note

## 2010-10-05 NOTE — Telephone Encounter (Signed)
Pt called stating URI sxs are worsening. Pt c/o low grade temperature, productive cough and body aches. Pt is requesting Rx for Z pak to pharmacy.

## 2010-10-05 NOTE — Telephone Encounter (Signed)
Pt informed

## 2010-10-05 NOTE — Telephone Encounter (Signed)
Pt called stating URI sxs are worsening, low grade temperature, with productive cough and body pain. Pt is requesting Rx for Z pak to pharmacy, please advise.

## 2010-10-14 ENCOUNTER — Telehealth: Payer: Self-pay

## 2010-10-14 MED ORDER — AZITHROMYCIN 250 MG PO TABS: ORAL_TABLET | ORAL | Status: AC

## 2010-10-14 NOTE — Telephone Encounter (Signed)
Ok - but if persisting symptoms after completion of 2nd course, should ROV for reeval of problem

## 2010-10-14 NOTE — Telephone Encounter (Signed)
Pt called stating she is still sick after completing ABX 5 days ago. Pt has also started antihistamine but still has congestions, mild ST and cough. Pt is requesting. Pt is requesting a refill of ABX, [employer].

## 2010-11-16 ENCOUNTER — Ambulatory Visit (INDEPENDENT_AMBULATORY_CARE_PROVIDER_SITE_OTHER): Payer: Medicare Other | Admitting: Sports Medicine

## 2010-11-16 VITALS — BP 112/70 | Ht 62.0 in | Wt 124.0 lb

## 2010-11-16 DIAGNOSIS — M2020 Hallux rigidus, unspecified foot: Secondary | ICD-10-CM

## 2010-11-16 DIAGNOSIS — M79609 Pain in unspecified limb: Secondary | ICD-10-CM

## 2010-11-16 DIAGNOSIS — M79643 Pain in unspecified hand: Secondary | ICD-10-CM

## 2010-11-16 DIAGNOSIS — M2170 Unequal limb length (acquired), unspecified site: Secondary | ICD-10-CM

## 2010-11-16 DIAGNOSIS — M25673 Stiffness of unspecified ankle, not elsewhere classified: Secondary | ICD-10-CM

## 2010-11-16 MED ORDER — KETOPROFEN POWD: Status: DC

## 2010-11-17 ENCOUNTER — Other Ambulatory Visit: Payer: Self-pay | Admitting: Obstetrics and Gynecology

## 2010-11-17 DIAGNOSIS — M2020 Hallux rigidus, unspecified foot: Secondary | ICD-10-CM | POA: Insufficient documentation

## 2010-11-17 DIAGNOSIS — Z1231 Encounter for screening mammogram for malignant neoplasm of breast: Secondary | ICD-10-CM

## 2010-11-17 DIAGNOSIS — M2170 Unequal limb length (acquired), unspecified site: Secondary | ICD-10-CM | POA: Insufficient documentation

## 2010-11-17 NOTE — Assessment & Plan Note (Signed)
Ketoprofen topical given.  Follow up PRN.

## 2010-11-17 NOTE — Progress Notes (Signed)
  Subjective:    Patient ID: Cynthia Miller, female    DOB: 12-26-1945, 65 y.o.   MRN: [id number]  HPI 65 y/o female is here for orthotics.  She has a pair of orthotics with her that were made 5 years ago when she had a right great toe fusion.  She walks 4-5 miles daily and gets pain at the end of these walks.  Otherwise she doesn't have foot pain.    She also has had pain for many years at her right [facility] and would like to know if there is a treatment for this.   Review of Systems     Objective:   Physical Exam  Right hand: Hand is not tender to palpation Squaring and tenderness at the right [facility]  Right Foot: Longitudinal arch is preserved Transverse arch is not There is a surgical scar on the great toe The great toe is rigid; 10-30 degrees The feet have no callus  Left Foot The longitudinal arch is preserved  The transverse arch is not The feel have callus on the 2nd and 3rd metatarsals and the outside edge of the 1st and 5th digit. The great toe is less rigid than the right 30-45 Great toe is deviated laterally with hypertrophy of the MCP joint  Legs: Right 88cm total with 47.5cm upper segment Left 88.5 cm total, with 47 cm upper segment  Gait: Walking appeared neutral  Patient was fitted for a : standard, cushioned, semi-rigid orthotic. The orthotic was heated and afterward the patient stood on the orthotic blank positioned on the orthotic stand. The patient was positioned in subtalar neutral position and 10 degrees of ankle dorsiflexion in a weight bearing stance. After completion of molding, a stable base was applied to the orthotic blank. The blank was ground to a stable position for weight bearing. Size: 7 Base: black suede Posting: 1st ray posting          Assessment & Plan:

## 2010-11-17 NOTE — Assessment & Plan Note (Signed)
This was not corrected at this visit as it was under 1cm.  If the patient becomes symptomatic we will correct this with a lift.

## 2010-11-17 NOTE — Assessment & Plan Note (Signed)
We are treating this with a custom orthotic with a first ray post bilaterally.  She will return if she continues to have pain.

## 2010-12-18 ENCOUNTER — Ambulatory Visit (HOSPITAL_COMMUNITY)
Admission: RE | Admit: 2010-12-18 | Discharge: 2010-12-18 | Disposition: A | Discharge status: Home / Self Care | Payer: Medicare Other | Source: Ambulatory Visit | Attending: Obstetrics and Gynecology | Admitting: Obstetrics and Gynecology

## 2010-12-18 DIAGNOSIS — Z1231 Encounter for screening mammogram for malignant neoplasm of breast: Secondary | ICD-10-CM

## 2011-01-21 ENCOUNTER — Encounter: Payer: Self-pay | Admitting: *Deleted

## 2011-01-21 DIAGNOSIS — I959 Hypotension, unspecified: Secondary | ICD-10-CM | POA: Insufficient documentation

## 2011-01-21 DIAGNOSIS — M8580 Other specified disorders of bone density and structure, unspecified site: Secondary | ICD-10-CM | POA: Insufficient documentation

## 2011-01-21 DIAGNOSIS — D4989 Neoplasm of unspecified behavior of other specified sites: Secondary | ICD-10-CM | POA: Insufficient documentation

## 2011-01-21 DIAGNOSIS — M1990 Unspecified osteoarthritis, unspecified site: Secondary | ICD-10-CM | POA: Insufficient documentation

## 2011-02-10 ENCOUNTER — Encounter: Payer: Medicare Other | Admitting: Obstetrics and Gynecology

## 2011-03-09 ENCOUNTER — Other Ambulatory Visit: Payer: Self-pay | Admitting: Obstetrics and Gynecology

## 2011-03-09 DIAGNOSIS — M8580 Other specified disorders of bone density and structure, unspecified site: Secondary | ICD-10-CM

## 2011-03-17 ENCOUNTER — Ambulatory Visit (INDEPENDENT_AMBULATORY_CARE_PROVIDER_SITE_OTHER): Payer: Medicare Other | Admitting: Obstetrics and Gynecology

## 2011-03-17 ENCOUNTER — Encounter: Payer: Self-pay | Admitting: Obstetrics and Gynecology

## 2011-03-17 ENCOUNTER — Other Ambulatory Visit: Payer: Self-pay | Admitting: Obstetrics and Gynecology

## 2011-03-17 VITALS — BP 118/74 | Ht 63.0 in | Wt 126.0 lb

## 2011-03-17 DIAGNOSIS — N952 Postmenopausal atrophic vaginitis: Secondary | ICD-10-CM | POA: Diagnosis not present

## 2011-03-17 DIAGNOSIS — R350 Frequency of micturition: Secondary | ICD-10-CM

## 2011-03-17 DIAGNOSIS — N951 Menopausal and female climacteric states: Secondary | ICD-10-CM

## 2011-03-17 DIAGNOSIS — M899 Disorder of bone, unspecified: Secondary | ICD-10-CM | POA: Diagnosis not present

## 2011-03-17 DIAGNOSIS — N870 Mild cervical dysplasia: Secondary | ICD-10-CM | POA: Insufficient documentation

## 2011-03-17 DIAGNOSIS — Z780 Asymptomatic menopausal state: Secondary | ICD-10-CM

## 2011-03-17 DIAGNOSIS — M8580 Other specified disorders of bone density and structure, unspecified site: Secondary | ICD-10-CM

## 2011-03-17 DIAGNOSIS — M949 Disorder of cartilage, unspecified: Secondary | ICD-10-CM

## 2011-03-17 LAB — URINALYSIS W MICROSCOPIC + REFLEX CULTURE
Bilirubin Urine: NEGATIVE
Crystals: NONE SEEN
Glucose, UA: NEGATIVE mg/dL
Ketones, ur: NEGATIVE mg/dL
Protein, ur: NEGATIVE mg/dL
RBC / HPF: NONE SEEN RBC/hpf (ref <3)
Specific Gravity, Urine: 1.03 (ref 1.005–1.030)
Urobilinogen, UA: 0.2 mg/dL (ref 0.0–1.0)

## 2011-03-17 NOTE — Patient Instructions (Signed)
Bone density tomorrow.

## 2011-03-17 NOTE — Progress Notes (Signed)
Patient came back to see me today for further followup. She no longer takes her HRT. She still has hot flashes but today are tolerable. They use a lubricant with intercourse due to vaginal dryness. She feels is adequate. She is having no vaginal bleeding. She is having no pelvic pain. She has nocturia x1-2 without urgency or incontinence. She has osteopenia. She started Fosamax in January of 05. She switched to [facility] after several years and has been on drug holiday for slightly over a year. We had given her samples of atelvia  last year but she elected not to continue. She had reflux with the other drugs. She is having a bone density tomorrow. She continues with calcium and vitamin D. She has had no fractures.  ROS: Pertinent positives above.12 system review done. Only other positive is osteoarthritis of her right hand.  Physical examination:   Kennon Portela present. HEENT within normal limits. Neck: Thyroid not large. No masses. Supraclavicular nodes: not enlarged. Breasts: Examined in both sitting midline position. No skin changes and no masses. Abdomen: Soft no guarding rebound or masses or hernia. Pelvic: External: Within normal limits. BUS: Within normal limits. Vaginal:within normal limits. Poor estrogen effect. No evidence of cystocele rectocele or enterocele. Cervix: clean. Uterus: Normal size and shape. Adnexa: No masses. Rectovaginal exam: Confirmatory and negative. Extremities: Within normal limits.  Assessment: #1. Menopausal symptoms #2. Atrophic vaginitis #3. Osteopenia #4. Nocturia  Plan: A detailed discussion on all the above had. For the moment no treatment indicated. We will decide about treatment of her osteopenia after review of her bone density.

## 2011-03-18 ENCOUNTER — Ambulatory Visit (INDEPENDENT_AMBULATORY_CARE_PROVIDER_SITE_OTHER): Payer: Medicare Other

## 2011-03-18 DIAGNOSIS — M949 Disorder of cartilage, unspecified: Secondary | ICD-10-CM

## 2011-03-18 DIAGNOSIS — M8580 Other specified disorders of bone density and structure, unspecified site: Secondary | ICD-10-CM

## 2011-03-18 DIAGNOSIS — M899 Disorder of bone, unspecified: Secondary | ICD-10-CM | POA: Diagnosis not present

## 2011-03-19 LAB — URINE CULTURE: Colony Count: NO GROWTH

## 2011-06-28 ENCOUNTER — Encounter: Payer: Self-pay | Admitting: Endocrinology

## 2011-06-28 ENCOUNTER — Ambulatory Visit (INDEPENDENT_AMBULATORY_CARE_PROVIDER_SITE_OTHER): Payer: Medicare Other | Admitting: Endocrinology

## 2011-06-28 VITALS — BP 122/78 | HR 70 | Temp 97.3°F | Ht 62.5 in | Wt 130.0 lb

## 2011-06-28 DIAGNOSIS — W57XXXA Bitten or stung by nonvenomous insect and other nonvenomous arthropods, initial encounter: Secondary | ICD-10-CM

## 2011-06-28 DIAGNOSIS — T148XXA Other injury of unspecified body region, initial encounter: Secondary | ICD-10-CM | POA: Diagnosis not present

## 2011-06-28 DIAGNOSIS — T148 Other injury of unspecified body region: Secondary | ICD-10-CM

## 2011-06-28 MED ORDER — HALOBETASOL PROPIONATE 0.05 % EX CREA: TOPICAL_CREAM | Freq: Three times a day (TID) | CUTANEOUS | Status: DC | PRN

## 2011-06-28 MED ORDER — DOXYCYCLINE HYCLATE 100 MG PO TABS: 100.0000 mg | ORAL_TABLET | Freq: Two times a day (BID) | ORAL | Status: AC

## 2011-06-28 NOTE — Progress Notes (Signed)
Subjective:    Patient ID: Cynthia Miller, female    DOB: Nov 22, 1945, 66 y.o.   MRN: [id number]  HPI Pt [place].  She does not recall being stung.  However, she has 1 week of slight rash at the left ankle, and assoc itching Past Medical History  Diagnosis Date  . Low blood pressure   . Osteoarthritis     THUMB  . ALLERGIC RHINITIS   . FIBROCYSTIC BREAST DISEASE   . Osteopenia   . CIN I (cervical intraepithelial neoplasia I)     Past Surgical History  Procedure Date  . Colonoscopy   . Right foot   . Breast cyst aspiration   . Laparoscopic tubal ligation   . Tubal ligation   . Colposcopy   . Cervical cone biopsy     History   Social History  . Marital Status: Married    Spouse Name: N/A    Number of Children: N/A  . Years of Education: N/A   Occupational History  . Not on file.   Social History Main Topics  . Smoking status: Never Smoker   . Smokeless tobacco: Not on file  . Alcohol Use: 8.4 oz/week    14 Glasses of wine per week  . Drug Use: No  . Sexually Active: Yes    Birth Control/ Protection: Surgical   Other Topics Concern  . Not on file   Social History Narrative  . No narrative on file    Current Outpatient Prescriptions on File Prior to Visit  Medication Sig Dispense Refill  . Calcium-Vitamin D (CALTRATE 600 PLUS-VIT D PO) Take 1 capsule by mouth daily.        Marland Petrakis loratadine-pseudoephedrine (CLARITIN-D 24-HOUR) 10-240 MG per 24 hr tablet Take 1 tablet by mouth daily.        . Multiple Vitamin (MULTIVITAMIN PO) Take 1 tablet by mouth daily.        . Omega-3 Fatty Acids (FISH OIL) 1200 MG CAPS Take 1 capsule by mouth daily.        . chlorpheniramine-hydrocodone (TUSSIONEX) 8-10 MG/5ML suspension Take 5 mLs by mouth every 12 (twelve) hours as needed for cough.  60 mL  0  . Ketoprofen POWD Apply to affected area four times per day as needed.  60 g  PRN   Current Facility-Administered Medications on File Prior  to Visit  Medication Dose Route Frequency Provider Last Rate Last Dose  . 0.9 %  sodium chloride infusion  500 mL Intravenous Continuous Hilarie Fredrickson, MD        Allergies  Allergen Reactions  . Penicillins     REACTION: Hives    Family History  Problem Relation Age of Onset  . Diverticulosis Father   . Lung cancer Father   . Colon cancer Neg Hx   . Hyperlipidemia Mother   . Thyroid disease Mother   . Breast cancer Paternal Grandmother     BP 122/78  Pulse 70  Temp(Src) 97.3 F (36.3 C) (Oral)  Ht 5' 2.5" (1.588 m)  Wt 130 lb (58.968 kg)  BMI 23.40 kg/m2  SpO2 99%  Review of Systems Denies fever, but she has slight pain there.      Objective:   Physical Exam VITAL SIGNS:  See vs page GENERAL: no distress Left leg, just proximal to the medial malleolus:  Insect sting would, with 1 cm rim of erythema.  There is also slight erythema a few  cm distal to the wound.      Assessment & Plan:  Insect bite, with early cellulitis.

## 2011-06-28 NOTE — Patient Instructions (Signed)
i have sent 2 prescriptions to your pharmacy: for an antibiotic, and anti-itch cream.   I hope you feel better soon.  If you don't feel better by next week, please call back.  Please call sooner if the rash gets worse.

## 2011-11-17 ENCOUNTER — Other Ambulatory Visit: Payer: Self-pay | Admitting: Obstetrics and Gynecology

## 2011-11-17 DIAGNOSIS — Z1231 Encounter for screening mammogram for malignant neoplasm of breast: Secondary | ICD-10-CM

## 2011-11-24 ENCOUNTER — Ambulatory Visit (INDEPENDENT_AMBULATORY_CARE_PROVIDER_SITE_OTHER): Payer: Medicare Other | Admitting: Internal Medicine

## 2011-11-24 ENCOUNTER — Encounter: Payer: Self-pay | Admitting: Internal Medicine

## 2011-11-24 VITALS — BP 120/78 | HR 74 | Temp 97.8°F | Ht 62.5 in | Wt 130.2 lb

## 2011-11-24 DIAGNOSIS — J069 Acute upper respiratory infection, unspecified: Secondary | ICD-10-CM | POA: Diagnosis not present

## 2011-11-24 DIAGNOSIS — Z23 Encounter for immunization: Secondary | ICD-10-CM | POA: Diagnosis not present

## 2011-11-24 MED ORDER — AZITHROMYCIN 250 MG PO TABS: ORAL_TABLET | ORAL | Status: DC

## 2011-11-24 NOTE — Progress Notes (Signed)
Subjective:    Patient ID: Cynthia Miller, female    DOB: December 08, 1945, 66 y.o.   MRN: [id number]  HPI Cynthia Miller presents with a week long h/o increased raspy throat, increased congestion but does drain due claritin D 24 hr. She has had a cough productive of sputum. No fever but she did have some chills. No SOB, fatigued. She has in the past seen Dr. Chalfant Callas for allergy evaluation.   Past Medical History  Diagnosis Date  . Low blood pressure   . Osteoarthritis     THUMB  . ALLERGIC RHINITIS   . FIBROCYSTIC BREAST DISEASE   . Osteopenia   . CIN I (cervical intraepithelial neoplasia I)    Past Surgical History  Procedure Date  . Colonoscopy   . Right foot   . Breast cyst aspiration   . Laparoscopic tubal ligation   . Tubal ligation   . Colposcopy   . Cervical cone biopsy    Family History  Problem Relation Age of Onset  . Diverticulosis Father   . Lung cancer Father   . Colon cancer Neg Hx   . Hyperlipidemia Mother   . Thyroid disease Mother   . Breast cancer Paternal Grandmother    History   Social History  . Marital Status: Married    Spouse Name: N/A    Number of Children: 1  . Years of Education: 16   Occupational History  . volunteer    Social History Main Topics  . Smoking status: Never Smoker   . Smokeless tobacco: Never Used  . Alcohol Use: 8.4 oz/week    14 Glasses of wine per week  . Drug Use: No  . Sexually Active: Yes -- Female partner(s)    Birth Control/ Protection: Surgical   Other Topics Concern  . Not on file   Social History Narrative   [employer], [employer] - [place] in [place] to finish BA Art history. Married 1969 - 10 yr/divorce. Remarried '83 - 10 yrs/divorced. Remarried '97 -. 1 dtr - '74. 2 grandchildren. Work - volunteering. Marriage in good health.     Current Outpatient Prescriptions on File Prior to Visit  Medication Sig Dispense Refill  . Calcium-Vitamin D (CALTRATE 600 PLUS-VIT D PO) Take 1 capsule by mouth daily.         Marland Severns loratadine-pseudoephedrine (CLARITIN-D 24-HOUR) 10-240 MG per 24 hr tablet Take 1 tablet by mouth daily.        . Multiple Vitamin (MULTIVITAMIN PO) Take 1 tablet by mouth daily.        . Omega-3 Fatty Acids (FISH OIL) 1200 MG CAPS Take 1 capsule by mouth daily.        . Ketoprofen POWD Apply to affected area four times per day as needed.  60 g  PRN   Current Facility-Administered Medications on File Prior to Visit  Medication Dose Route Frequency Provider Last Rate Last Dose  . 0.9 %  sodium chloride infusion  500 mL Intravenous Continuous Hilarie Fredrickson, MD          Review of Systems System review is negative for any constitutional, cardiac, pulmonary, GI or neuro symptoms or complaints other than as described in the HPI.     Objective:   Physical Exam Filed Vitals:   11/24/11 1625  BP: 120/78  Pulse: 74  Temp: 97.8 F (36.6 C)   Gen'l- WNWD white woman looking younger than her stated age HEENT_ mild tenderness over frontal sinus, TMs normal, Throat clear Cor-  RRR Pulm - normal respirations. CTAP       Assessment & Plan:  URI - Plan - Z-pak  Supportive care.

## 2011-11-24 NOTE — Patient Instructions (Addendum)
An upper respiratory infection - throat is clear. For full coverage of any upper respiratory infection will [place] a Z-Pak. Also, nasal wash, gargle of choice, vitamin C.  For influenza vaccine today.

## 2011-12-21 ENCOUNTER — Ambulatory Visit (HOSPITAL_COMMUNITY)
Admission: RE | Admit: 2011-12-21 | Discharge: 2011-12-21 | Disposition: A | Discharge status: Home / Self Care | Payer: Medicare Other | Source: Ambulatory Visit | Attending: Obstetrics and Gynecology | Admitting: Obstetrics and Gynecology

## 2011-12-21 DIAGNOSIS — Z1231 Encounter for screening mammogram for malignant neoplasm of breast: Secondary | ICD-10-CM | POA: Diagnosis not present

## 2012-01-13 DIAGNOSIS — Z411 Encounter for cosmetic surgery: Secondary | ICD-10-CM | POA: Diagnosis not present

## 2012-01-13 DIAGNOSIS — D239 Other benign neoplasm of skin, unspecified: Secondary | ICD-10-CM | POA: Diagnosis not present

## 2012-01-13 DIAGNOSIS — L570 Actinic keratosis: Secondary | ICD-10-CM | POA: Diagnosis not present

## 2012-03-01 DIAGNOSIS — H524 Presbyopia: Secondary | ICD-10-CM | POA: Diagnosis not present

## 2012-03-01 DIAGNOSIS — H2510 Age-related nuclear cataract, unspecified eye: Secondary | ICD-10-CM | POA: Diagnosis not present

## 2012-03-01 DIAGNOSIS — H52209 Unspecified astigmatism, unspecified eye: Secondary | ICD-10-CM | POA: Diagnosis not present

## 2012-03-28 DIAGNOSIS — L570 Actinic keratosis: Secondary | ICD-10-CM | POA: Diagnosis not present

## 2012-03-28 DIAGNOSIS — I781 Nevus, non-neoplastic: Secondary | ICD-10-CM | POA: Diagnosis not present

## 2012-11-27 ENCOUNTER — Other Ambulatory Visit: Payer: Self-pay | Admitting: Internal Medicine

## 2012-11-27 DIAGNOSIS — Z1231 Encounter for screening mammogram for malignant neoplasm of breast: Secondary | ICD-10-CM

## 2012-12-19 ENCOUNTER — Other Ambulatory Visit: Payer: Self-pay | Admitting: Dermatology

## 2012-12-19 ENCOUNTER — Encounter: Payer: Medicare Other | Admitting: Internal Medicine

## 2012-12-19 DIAGNOSIS — D239 Other benign neoplasm of skin, unspecified: Secondary | ICD-10-CM | POA: Diagnosis not present

## 2012-12-19 DIAGNOSIS — Z411 Encounter for cosmetic surgery: Secondary | ICD-10-CM | POA: Diagnosis not present

## 2012-12-19 DIAGNOSIS — D485 Neoplasm of uncertain behavior of skin: Secondary | ICD-10-CM | POA: Diagnosis not present

## 2012-12-19 DIAGNOSIS — T148 Other injury of unspecified body region: Secondary | ICD-10-CM | POA: Diagnosis not present

## 2012-12-25 ENCOUNTER — Ambulatory Visit (HOSPITAL_COMMUNITY)
Admission: RE | Admit: 2012-12-25 | Discharge: 2012-12-25 | Disposition: A | Discharge status: Home / Self Care | Payer: Medicare Other | Source: Ambulatory Visit | Attending: Internal Medicine | Admitting: Internal Medicine

## 2012-12-25 DIAGNOSIS — Z1231 Encounter for screening mammogram for malignant neoplasm of breast: Secondary | ICD-10-CM | POA: Insufficient documentation

## 2013-02-06 ENCOUNTER — Encounter: Payer: Self-pay | Admitting: Internal Medicine

## 2013-02-06 ENCOUNTER — Other Ambulatory Visit (INDEPENDENT_AMBULATORY_CARE_PROVIDER_SITE_OTHER): Payer: Medicare Other

## 2013-02-06 ENCOUNTER — Ambulatory Visit (INDEPENDENT_AMBULATORY_CARE_PROVIDER_SITE_OTHER): Payer: Medicare Other | Admitting: Internal Medicine

## 2013-02-06 VITALS — BP 110/70 | HR 83 | Temp 96.8°F | Resp 20 | Ht 62.5 in | Wt 132.0 lb

## 2013-02-06 DIAGNOSIS — M8580 Other specified disorders of bone density and structure, unspecified site: Secondary | ICD-10-CM

## 2013-02-06 DIAGNOSIS — Z0000 Encounter for general adult medical examination without abnormal findings: Secondary | ICD-10-CM

## 2013-02-06 DIAGNOSIS — R5381 Other malaise: Secondary | ICD-10-CM

## 2013-02-06 DIAGNOSIS — M899 Disorder of bone, unspecified: Secondary | ICD-10-CM | POA: Diagnosis not present

## 2013-02-06 DIAGNOSIS — Z136 Encounter for screening for cardiovascular disorders: Secondary | ICD-10-CM

## 2013-02-06 DIAGNOSIS — Z23 Encounter for immunization: Secondary | ICD-10-CM

## 2013-02-06 DIAGNOSIS — R5383 Other fatigue: Secondary | ICD-10-CM

## 2013-02-06 LAB — LIPID PANEL
Cholesterol: 170 mg/dL (ref 0–200)
HDL: 63.3 mg/dL (ref >39.00)
LDL Cholesterol: 100 mg/dL — ABNORMAL HIGH (ref 0–99)
Total CHOL/HDL Ratio: 3
Triglycerides: 32 mg/dL (ref 0.0–149.0)

## 2013-02-06 LAB — BASIC METABOLIC PANEL
CO2: 27 mEq/L (ref 19–32)
Calcium: 9 mg/dL (ref 8.4–10.5)
Chloride: 103 mEq/L (ref 96–112)
Sodium: 136 mEq/L (ref 135–145)

## 2013-02-06 LAB — CBC WITH DIFFERENTIAL/PLATELET
Basophils Relative: 0.5 % (ref 0.0–3.0)
Eosinophils Absolute: 0.1 10*3/uL (ref 0.0–0.7)
Eosinophils Relative: 1.2 % (ref 0.0–5.0)
Hemoglobin: 14.5 g/dL (ref 12.0–15.0)
Lymphocytes Relative: 29 % (ref 12.0–46.0)
MCHC: 33.8 g/dL (ref 30.0–36.0)
MCV: 90.3 fl (ref 78.0–100.0)
Monocytes Absolute: 0.5 10*3/uL (ref 0.1–1.0)
Neutro Abs: 3.7 10*3/uL (ref 1.4–7.7)
RBC: 4.75 Mil/uL (ref 3.87–5.11)

## 2013-02-06 LAB — HEPATIC FUNCTION PANEL
AST: 19 U/L (ref 0–37)
Albumin: 4.2 g/dL (ref 3.5–5.2)

## 2013-02-06 NOTE — Assessment & Plan Note (Signed)
DEXA March 17, 2011 gynecology with osteopenia Remotely on Boniva - discontinued same due to concern for potential long-term side effects Also declines additional calcium or vitamin D supplementation Ongoing weight bearing activity with walking 4 miles 3-5 times weekly Recheck DEXA January 2015, patient requests [facility] Continue supportive care, patient education provided

## 2013-02-06 NOTE — Patient Instructions (Addendum)
It was good to see you today.  We have reviewed your prior records including labs and tests today  Health Maintenance reviewed - all recommended immunizations and age-appropriate screenings are up-to-date.  Prevnar immunization updated today  [facility] February 2015 . Our office will contact you regarding appointment(s) once made.  Medications reviewed and updated, no changes recommended at this time.  Test(s) ordered today. Your results will be released to MyChart (or called to you) after review, usually within 72hours after test completion. If any changes need to be made, you will be notified at that same time.  Please schedule followup in 12 months, call sooner if problems.  Health Maintenance, Female A healthy lifestyle and preventative care can promote health and wellness.  Maintain regular health, dental, and eye exams.  Eat a healthy diet. Foods like vegetables, fruits, whole grains, low-fat dairy products, and lean protein foods contain the nutrients you need without too many calories. Decrease your intake of foods high in solid fats, added sugars, and salt. Get information about a proper diet from your caregiver, if necessary.  Regular physical exercise is one of the most important things you can do for your health. Most adults should get at least 150 minutes of moderate-intensity exercise (any activity that increases your heart rate and causes you to sweat) each week. In addition, most adults need muscle-strengthening exercises on 2 or more days a week.   Maintain a healthy weight. The body mass index (BMI) is a screening tool to identify possible weight problems. It provides an estimate of body fat based on height and weight. Your caregiver can help determine your BMI, and can help you achieve or maintain a healthy weight. For adults 20 years and older:  A BMI below 18.5 is considered underweight.  A BMI of 18.5 to  24.9 is normal.  A BMI of 25 to 29.9 is considered overweight.  A BMI of 30 and above is considered obese.  Maintain normal blood lipids and cholesterol by exercising and minimizing your intake of saturated fat. Eat a balanced diet with plenty of fruits and vegetables. Blood tests for lipids and cholesterol should begin at age 98 and be repeated every 5 years. If your lipid or cholesterol levels are high, you are over 50, or you are a high risk for heart disease, you may need your cholesterol levels checked more frequently.Ongoing high lipid and cholesterol levels should be treated with medicines if diet and exercise are not effective.  If you smoke, find out from your caregiver how to quit. If you do not use tobacco, do not start.  Lung cancer screening is recommended for adults aged 85 80 years who are at high risk for developing lung cancer because of a history of smoking. Yearly low-dose computed tomography (CT) is recommended for people who have at least a 30-pack-year history of smoking and are a current smoker or have quit within the past 15 years. A pack year of smoking is smoking an average of 1 pack of cigarettes a day for 1 year (for example: 1 pack a day for 30 years or 2 packs a day for 15 years). Yearly screening should continue until the smoker has stopped smoking for at least 15 years. Yearly screening should also be stopped for people who develop a health problem that would prevent them from having lung cancer treatment.  If you are pregnant, do not drink alcohol. If you are breastfeeding,  be very cautious about drinking alcohol. If you are not pregnant and choose to drink alcohol, do not exceed 1 drink per day. One drink is considered to be 12 ounces (355 mL) of beer, 5 ounces (148 mL) of wine, or 1.5 ounces (44 mL) of liquor.  Avoid use of street drugs. Do not share needles with anyone. Ask for help if you need support or instructions about stopping the use of drugs.  High blood  pressure causes heart disease and increases the risk of stroke. Blood pressure should be checked at least every 1 to 2 years. Ongoing high blood pressure should be treated with medicines, if weight loss and exercise are not effective.  If you are 35 to 67 years old, ask your caregiver if you should take aspirin to prevent strokes.  Diabetes screening involves taking a blood sample to check your fasting blood sugar level. This should be done once every 3 years, after age 81, if you are within normal weight and without risk factors for diabetes. Testing should be considered at a younger age or be carried out more frequently if you are overweight and have at least 1 risk factor for diabetes.  Breast cancer screening is essential preventative care for women. You should practice "breast self-awareness." This means understanding the normal appearance and feel of your breasts and may include breast self-examination. Any changes detected, no matter how small, should be reported to a caregiver. Women in their 47s and 30s should have a clinical breast exam (CBE) by a caregiver as part of a regular health exam every 1 to 3 years. After age 78, women should have a CBE every year. Starting at age 72, women should consider having a mammogram (breast X-ray) every year. Women who have a family history of breast cancer should talk to their caregiver about genetic screening. Women at a high risk of breast cancer should talk to their caregiver about having an MRI and a mammogram every year.  Breast cancer gene (BRCA)-related cancer risk assessment is recommended for women who have family members with BRCA-related cancers. BRCA-related cancers include breast, ovarian, tubal, and peritoneal cancers. Having family members with these cancers may be associated with an increased risk for harmful changes (mutations) in the breast cancer genes BRCA1 and BRCA2. Results of the assessment will determine the need for genetic counseling  and BRCA1 and BRCA2 testing.  The Pap test is a screening test for cervical cancer. Women should have a Pap test starting at age 66. Between ages 5 and 91, Pap tests should be repeated every 2 years. Beginning at age 33, you should have a Pap test every 3 years as long as the past 3 Pap tests have been normal. If you had a hysterectomy for a problem that was not cancer or a condition that could lead to cancer, then you no longer need Pap tests. If you are between ages 40 and 43, and you have had normal Pap tests going back 10 years, you no longer need Pap tests. If you have had past treatment for cervical cancer or a condition that could lead to cancer, you need Pap tests and screening for cancer for at least 20 years after your treatment. If Pap tests have been discontinued, risk factors (such as a new sexual partner) need to be reassessed to determine if screening should be resumed. Some women have medical problems that increase the chance of getting cervical cancer. In these cases, your caregiver may recommend more frequent screening and  Pap tests.  The human papillomavirus (HPV) test is an additional test that may be used for cervical cancer screening. The HPV test looks for the virus that can cause the cell changes on the cervix. The cells collected during the Pap test can be tested for HPV. The HPV test could be used to screen women aged 27 years and older, and should be used in women of any age who have unclear Pap test results. After the age of 68, women should have HPV testing at the same frequency as a Pap test.  Colorectal cancer can be detected and often prevented. Most routine colorectal cancer screening begins at the age of 50 and continues through age 4. However, your caregiver may recommend screening at an earlier age if you have risk factors for colon cancer. On a yearly basis, your caregiver may provide home test kits to check for hidden blood in the stool. Use of a small camera at the end  of a tube, to directly examine the colon (sigmoidoscopy or colonoscopy), can detect the earliest forms of colorectal cancer. Talk to your caregiver about this at age 6, when routine screening begins. Direct examination of the colon should be repeated every 5 to 10 years through age 67, unless early forms of pre-cancerous polyps or small growths are found.  Hepatitis C blood testing is recommended for all people born from [birth year] through 1965 and any individual with known risks for hepatitis C.  Practice safe sex. Use condoms and avoid high-risk sexual practices to reduce the spread of sexually transmitted infections (STIs). Sexually active women aged 66 and younger should be checked for Chlamydia, which is a common sexually transmitted infection. Older women with new or multiple partners should also be tested for Chlamydia. Testing for other STIs is recommended if you are sexually active and at increased risk.  Osteoporosis is a disease in which the bones lose minerals and strength with aging. This can result in serious bone fractures. The risk of osteoporosis can be identified using a bone density scan. Women ages 85 and over and women at risk for fractures or osteoporosis should discuss screening with their caregivers. Ask your caregiver whether you should be taking a calcium supplement or vitamin D to reduce the rate of osteoporosis.  Menopause can be associated with physical symptoms and risks. Hormone replacement therapy is available to decrease symptoms and risks. You should talk to your caregiver about whether hormone replacement therapy is right for you.  Use sunscreen. Apply sunscreen liberally and repeatedly throughout the day. You should seek shade when your shadow is shorter than you. Protect yourself by wearing long sleeves, pants, a wide-brimmed hat, and sunglasses year round, whenever you are outdoors.  Notify your caregiver of new moles or changes in moles, especially if there is a change  in shape or color. Also notify your caregiver if a mole is larger than the size of a pencil eraser.  Stay current with your immunizations. Document Released: 08/24/2010 Document Revised: 06/05/2012 Document Reviewed: 08/24/2010 [facility] Patient Information 2014 [place], [place].

## 2013-02-06 NOTE — Progress Notes (Signed)
Pre-visit discussion using our clinic review tool. No additional management support is needed unless otherwise documented below in the visit note.  

## 2013-02-06 NOTE — Progress Notes (Signed)
Subjective:    Patient ID: Cynthia Miller, female    DOB: March 07, 1945, 68 y.o.   MRN: [id number]  HPI  New to me, here for AWV - transfer from [facility]   Here for medicare wellness  Diet: heart healthy Physical activity: active - walks 3-4x/day Depression/mood screen: negative Hearing: intact to whispered voice Visual acuity: grossly normal, performs annual eye exam  ADLs: capable Fall risk: none Home safety: good Cognitive evaluation: intact to orientation, naming, recall and repetition EOL planning: adv directives, full code/ I agree  I have personally reviewed and have noted 1. The patient's medical and social history 2. Their use of alcohol, tobacco or illicit drugs 3. Their current medications and supplements 4. The patient's functional ability including ADL's, fall risks, home safety risks and hearing or visual impairment. 5. Diet and physical activities 6. Evidence for depression or mood disorders  Also reviewed chronic medical issues and interval medical events  Past Medical History  Diagnosis Date  . Low blood pressure   . Osteoarthritis     THUMB  . ALLERGIC RHINITIS   . FIBROCYSTIC BREAST DISEASE   . Osteopenia   . CIN I (cervical intraepithelial neoplasia I)    Family History  Problem Relation Age of Onset  . Diverticulosis Father   . Lung cancer Father   . Colon cancer Neg Hx   . Hyperlipidemia Mother   . Thyroid disease Mother   . Breast cancer Paternal Grandmother    History  Substance Use Topics  . Smoking status: Never Smoker   . Smokeless tobacco: Never Used  . Alcohol Use: 8.4 oz/week    14 Glasses of wine per week    Review of Systems  Constitutional: Positive for fatigue. Negative for unexpected weight change.  Respiratory: Negative for cough, shortness of breath and wheezing.   Cardiovascular: Negative for chest pain, palpitations and leg swelling.  Gastrointestinal: Negative for nausea, abdominal pain and diarrhea.    Neurological: Negative for dizziness, weakness, light-headedness and headaches.  Psychiatric/Behavioral: Negative for dysphoric mood. The patient is not nervous/anxious.   All other systems reviewed and are negative.       Objective:   Physical Exam  BP 110/70  Pulse 83  Temp(Src) 96.8 F (36 C) (Oral)  Resp 20  Ht 5' 2.5" (1.588 m)  Wt 132 lb (59.875 kg)  BMI 23.74 kg/m2 Wt Readings from Last 3 Encounters:  02/06/13 132 lb (59.875 kg)  11/24/11 130 lb 4 oz (59.081 kg)  06/28/11 130 lb (58.968 kg)   Constitutional: She appears well-developed and well-nourished. No distress.  HENT: Head: Normocephalic and atraumatic. Ears: B TMs ok, no erythema or effusion; Nose: Nose normal. Mouth/Throat: Oropharynx is clear and moist. No oropharyngeal exudate.  Eyes: Conjunctivae and EOM are normal. Pupils are equal, round, and reactive to light. No scleral icterus.  Neck: Normal range of motion. Neck supple. No JVD present. No thyromegaly present.  Cardiovascular: Normal rate, regular rhythm and normal heart sounds.  No murmur heard. No BLE edema. Pulmonary/Chest: Effort normal and breath sounds normal. No respiratory distress. She has no wheezes.  Abdominal: Soft. Bowel sounds are normal. She exhibits no distension. There is no tenderness. no masses Musculoskeletal: post trauma arthritic changes L thumb base CMC -otherwise, Normal range of motion, no joint effusions. No gross deformities Neurological: She is alert and oriented to person, place, and time. No cranial nerve deficit. Coordination, balance, strength, speech and gait are normal.  Skin: Skin is warm and  dry. No rash noted. No erythema.  Psychiatric: She has a normal mood and affect. Her behavior is normal. Judgment and thought content normal.   No results found for this basename: WBC, HGB, HCT, PLT, GLUCOSE, CHOL, TRIG, HDL, LDLDIRECT, LDLCALC, ALT, AST, NA, K, CL, CREATININE, BUN, CO2, TSH, PSA, INR, GLUF, HGBA1C, MICROALBUR       Assessment & Plan:   AWV/v70.0 - Today patient counseled on age appropriate routine health concerns for screening and prevention, each reviewed and up to date or declined. Immunizations reviewed and up to date or declined. Labs ordered and reviewed. Risk factors for depression reviewed and negative. Hearing function and visual acuity are intact. ADLs screened and addressed as needed. Functional ability and level of safety reviewed and appropriate. Education, counseling and referrals performed based on assessed risks today. Patient provided with a copy of personalized plan for preventive services.   Fatigue - nonspecific symptoms/exam - check screening labs  Also See problem list. Medications and labs reviewed today.

## 2013-02-07 ENCOUNTER — Encounter: Payer: Self-pay | Admitting: Internal Medicine

## 2013-02-07 LAB — VITAMIN D 25 HYDROXY (VIT D DEFICIENCY, FRACTURES): Vit D, 25-Hydroxy: 44 ng/mL (ref 30–89)

## 2013-02-27 ENCOUNTER — Encounter: Payer: Self-pay | Admitting: Internal Medicine

## 2013-03-27 ENCOUNTER — Encounter: Payer: Self-pay | Admitting: Internal Medicine

## 2013-03-27 ENCOUNTER — Ambulatory Visit (HOSPITAL_COMMUNITY)
Admission: RE | Admit: 2013-03-27 | Discharge: 2013-03-27 | Disposition: A | Discharge status: Home / Self Care | Payer: Medicare Other | Source: Ambulatory Visit | Attending: Internal Medicine | Admitting: Internal Medicine

## 2013-03-27 DIAGNOSIS — Z780 Asymptomatic menopausal state: Secondary | ICD-10-CM | POA: Insufficient documentation

## 2013-03-27 DIAGNOSIS — M899 Disorder of bone, unspecified: Secondary | ICD-10-CM | POA: Diagnosis not present

## 2013-03-27 DIAGNOSIS — M8580 Other specified disorders of bone density and structure, unspecified site: Secondary | ICD-10-CM

## 2013-03-27 DIAGNOSIS — Z13820 Encounter for screening for osteoporosis: Secondary | ICD-10-CM | POA: Insufficient documentation

## 2013-07-10 DIAGNOSIS — R059 Cough, unspecified: Secondary | ICD-10-CM | POA: Diagnosis not present

## 2013-07-10 DIAGNOSIS — R05 Cough: Secondary | ICD-10-CM | POA: Diagnosis not present

## 2013-07-10 DIAGNOSIS — J309 Allergic rhinitis, unspecified: Secondary | ICD-10-CM | POA: Diagnosis not present

## 2013-09-11 ENCOUNTER — Telehealth: Payer: Self-pay | Admitting: Internal Medicine

## 2013-09-11 NOTE — Telephone Encounter (Signed)
Pt call request booster for pneumonia injection, pt stated she got one back in December but she doesn't remember which one she suppose to come back and get. Please advise.

## 2013-09-11 NOTE — Telephone Encounter (Signed)
Patient states she wants pneumococcal polysaccharide 23

## 2013-09-12 NOTE — Telephone Encounter (Signed)
appt is set for 09/14/13.

## 2013-09-12 NOTE — Telephone Encounter (Signed)
Try to call pt, but can not leave vm, will keep trying

## 2013-09-12 NOTE — Telephone Encounter (Signed)
Ok to arrange nurse visit for pneumovax thanks

## 2013-09-14 ENCOUNTER — Ambulatory Visit (INDEPENDENT_AMBULATORY_CARE_PROVIDER_SITE_OTHER): Payer: Medicare Other

## 2013-09-14 DIAGNOSIS — Z23 Encounter for immunization: Secondary | ICD-10-CM | POA: Diagnosis not present

## 2013-09-20 ENCOUNTER — Encounter: Payer: Self-pay | Admitting: Internal Medicine

## 2013-11-22 ENCOUNTER — Other Ambulatory Visit: Payer: Self-pay | Admitting: Internal Medicine

## 2013-11-22 DIAGNOSIS — Z1231 Encounter for screening mammogram for malignant neoplasm of breast: Secondary | ICD-10-CM

## 2013-12-13 ENCOUNTER — Encounter: Payer: Self-pay | Admitting: Internal Medicine

## 2013-12-20 ENCOUNTER — Ambulatory Visit (INDEPENDENT_AMBULATORY_CARE_PROVIDER_SITE_OTHER): Payer: Medicare Other | Admitting: Sports Medicine

## 2013-12-20 ENCOUNTER — Encounter: Payer: Self-pay | Admitting: Sports Medicine

## 2013-12-20 VITALS — BP 125/83 | Ht 63.0 in | Wt 130.0 lb

## 2013-12-20 DIAGNOSIS — M2170 Unequal limb length (acquired), unspecified site: Secondary | ICD-10-CM | POA: Diagnosis not present

## 2013-12-20 DIAGNOSIS — M8580 Other specified disorders of bone density and structure, unspecified site: Secondary | ICD-10-CM | POA: Diagnosis not present

## 2013-12-20 DIAGNOSIS — M25562 Pain in left knee: Secondary | ICD-10-CM | POA: Insufficient documentation

## 2013-12-20 NOTE — Progress Notes (Signed)
Patient ID: Cynthia Miller, female   DOB: 1946/01/30, 68 y.o.   MRN: [id number]  Patient enjoys walking for fitness She walks 2-4 miles on most days She has a history of right bunion surgery and I bunion on the left foot We placed her in orthotics for these and that has been helpful  For the past 6 months she has had more lateral knee pain on the left She may see slight swelling She has noticed that this knee is less stable when she steps down a step or turns quickly  She comes for evaluation to see if there is any significant knee injury. She does state that she notices a little more swelling in the left lower leg and she has noticed that for a long time.  Physical examination Physically fit appearing female who looks younger than her stated age BP 125/83  Ht 5\' 3"  (1.6 m)  Wt 130 lb (58.968 kg)  BMI 23.03 kg/m2  RT Knee: Normal to inspection with no erythema or effusion or obvious bony abnormalities. Palpation normal with no warmth or joint line tenderness or patellar tenderness or condyle tenderness. ROM normal in flexion and extension and lower leg rotation. Ligaments with solid consistent endpoints including ACL, PCL, LCL, MCL. Negative Mcmurray's and provocative meniscal tests. Non painful patellar compression. Patellar and quadriceps tendons unremarkable. Hamstring and quadriceps strength is normal.  Left knee Ligamentous and McMurray's testing were all stable She does lack about 15 of full flexion compared to the right Slight puffiness in the suprapatellar pouch She gets pain with [facility] test She gets a feeling of less stability with stepping up an 8 inch step  MSK ultrasound There is a mild effusion in the suprapatellar pouch Quad tendon appears normal but there is a small spur at the superior patella Patellar tendon appears normal Medial meniscus appears normal Lateral meniscus shows some marginal swelling there is slight extrusion of the meniscus just posterior  to the midline There is a split in the lateral meniscus noted in the posterior third  Walking gait is without a limp

## 2013-12-20 NOTE — Assessment & Plan Note (Signed)
I gave her suggestions about the 90s hop therapy for osteopenia  This actually has better evidence for increasing bone density than walking up to 2 miles a day

## 2013-12-20 NOTE — Assessment & Plan Note (Signed)
Continue using orthotics as to help control her bunions and the small difference in leg length

## 2013-12-20 NOTE — Assessment & Plan Note (Signed)
She has hip abduction weakness on the left we will focus on strengthening  Use a compression sleeve to limit swelling  +give better stability   the knee does not show significant osteoarthritis  Continue to use orthotics   some exercises for quadriceps strengthening  Recheck in 3 months

## 2013-12-24 ENCOUNTER — Encounter: Payer: Self-pay | Admitting: Sports Medicine

## 2013-12-27 ENCOUNTER — Ambulatory Visit (HOSPITAL_COMMUNITY)
Admission: RE | Admit: 2013-12-27 | Discharge: 2013-12-27 | Disposition: A | Discharge status: Home / Self Care | Payer: Medicare Other | Source: Ambulatory Visit | Attending: Internal Medicine | Admitting: Internal Medicine

## 2013-12-27 DIAGNOSIS — Z1231 Encounter for screening mammogram for malignant neoplasm of breast: Secondary | ICD-10-CM

## 2014-01-07 DIAGNOSIS — D239 Other benign neoplasm of skin, unspecified: Secondary | ICD-10-CM | POA: Diagnosis not present

## 2014-01-07 DIAGNOSIS — Z86018 Personal history of other benign neoplasm: Secondary | ICD-10-CM | POA: Diagnosis not present

## 2014-01-23 DIAGNOSIS — J3089 Other allergic rhinitis: Secondary | ICD-10-CM | POA: Diagnosis not present

## 2014-04-17 ENCOUNTER — Encounter: Payer: Self-pay | Admitting: Internal Medicine

## 2014-04-17 ENCOUNTER — Ambulatory Visit (INDEPENDENT_AMBULATORY_CARE_PROVIDER_SITE_OTHER): Payer: PPO | Admitting: Internal Medicine

## 2014-04-17 ENCOUNTER — Other Ambulatory Visit (INDEPENDENT_AMBULATORY_CARE_PROVIDER_SITE_OTHER): Payer: PPO

## 2014-04-17 VITALS — BP 112/58 | HR 67 | Temp 97.7°F | Wt 138.0 lb

## 2014-04-17 DIAGNOSIS — Z0000 Encounter for general adult medical examination without abnormal findings: Secondary | ICD-10-CM

## 2014-04-17 DIAGNOSIS — M8580 Other specified disorders of bone density and structure, unspecified site: Secondary | ICD-10-CM

## 2014-04-17 LAB — URINALYSIS, ROUTINE W REFLEX MICROSCOPIC
BILIRUBIN URINE: NEGATIVE
Nitrite: NEGATIVE
PH: 5.5 (ref 5.0–8.0)
SPECIFIC GRAVITY, URINE: 1.025 (ref 1.000–1.030)
TOTAL PROTEIN, URINE-UPE24: NEGATIVE
Urine Glucose: NEGATIVE
Urobilinogen, UA: 0.2 (ref 0.0–1.0)

## 2014-04-17 LAB — LIPID PANEL
CHOLESTEROL: 174 mg/dL (ref 0–200)
HDL: 70.7 mg/dL (ref >39.00)
LDL Cholesterol: 95 mg/dL (ref 0–99)
NONHDL: 103.3
Total CHOL/HDL Ratio: 2
Triglycerides: 40 mg/dL (ref 0.0–149.0)
VLDL: 8 mg/dL (ref 0.0–40.0)

## 2014-04-17 LAB — CBC WITH DIFFERENTIAL/PLATELET
Basophils Absolute: 0 10*3/uL (ref 0.0–0.1)
Basophils Relative: 0.3 % (ref 0.0–3.0)
EOS ABS: 0.1 10*3/uL (ref 0.0–0.7)
Eosinophils Relative: 1 % (ref 0.0–5.0)
HCT: 43.7 % (ref 36.0–46.0)
HEMOGLOBIN: 14.9 g/dL (ref 12.0–15.0)
Lymphocytes Relative: 31.2 % (ref 12.0–46.0)
Lymphs Abs: 1.6 10*3/uL (ref 0.7–4.0)
MCHC: 34.2 g/dL (ref 30.0–36.0)
MCV: 90.4 fl (ref 78.0–100.0)
MONO ABS: 0.4 10*3/uL (ref 0.1–1.0)
Monocytes Relative: 7.6 % (ref 3.0–12.0)
NEUTROS ABS: 3.1 10*3/uL (ref 1.4–7.7)
Neutrophils Relative %: 59.9 % (ref 43.0–77.0)
Platelets: 235 10*3/uL (ref 150.0–400.0)
RBC: 4.84 Mil/uL (ref 3.87–5.11)
RDW: 13.6 % (ref 11.5–15.5)
WBC: 5.3 10*3/uL (ref 4.0–10.5)

## 2014-04-17 LAB — BASIC METABOLIC PANEL
BUN: 17 mg/dL (ref 6–23)
CHLORIDE: 106 meq/L (ref 96–112)
CO2: 28 mEq/L (ref 19–32)
Calcium: 9.3 mg/dL (ref 8.4–10.5)
Creatinine, Ser: 0.61 mg/dL (ref 0.40–1.20)
GFR: 103.33 mL/min (ref >60.00)
GLUCOSE: 103 mg/dL — AB (ref 70–99)
POTASSIUM: 4.1 meq/L (ref 3.5–5.1)
SODIUM: 139 meq/L (ref 135–145)

## 2014-04-17 LAB — HEPATIC FUNCTION PANEL
ALBUMIN: 4.2 g/dL (ref 3.5–5.2)
ALT: 24 U/L (ref 0–35)
AST: 19 U/L (ref 0–37)
Alkaline Phosphatase: 80 U/L (ref 39–117)
Bilirubin, Direct: 0.1 mg/dL (ref 0.0–0.3)
Total Bilirubin: 0.6 mg/dL (ref 0.2–1.2)
Total Protein: 7.1 g/dL (ref 6.0–8.3)

## 2014-04-17 LAB — TSH: TSH: 1.97 u[IU]/mL (ref 0.35–4.50)

## 2014-04-17 LAB — VITAMIN D 25 HYDROXY (VIT D DEFICIENCY, FRACTURES): VITD: 26.89 ng/mL — AB (ref 30.00–100.00)

## 2014-04-17 MED ORDER — MONTELUKAST SODIUM 10 MG PO TABS: 10.0000 mg | ORAL_TABLET | Freq: Every day | ORAL | Status: DC

## 2014-04-17 MED ORDER — LEVOCETIRIZINE DIHYDROCHLORIDE 5 MG PO TABS: 5.0000 mg | ORAL_TABLET | Freq: Every evening | ORAL | Status: DC

## 2014-04-17 NOTE — Progress Notes (Signed)
Pre visit review using our clinic review tool, if applicable. No additional management support is needed unless otherwise documented below in the visit note. 

## 2014-04-17 NOTE — Progress Notes (Signed)
Subjective:    Patient ID: Cynthia Miller, female    DOB: [birth year], 69 y.o.   MRN: [id number]  HPI   Here for medicare wellness  Diet: heart healthy  Physical activity: walking 2-4 mi/day 5-6d/wk Depression/mood screen: negative Hearing: intact to whispered voice Visual acuity: grossly normal, performs annual eye exam  ADLs: capable Fall risk: none Home safety: good Cognitive evaluation: intact to orientation, naming, recall and repetition EOL planning: adv directives, full code/ I agree  I have personally reviewed and have noted 1. The patient's medical and social history 2. Their use of alcohol, tobacco or illicit drugs 3. Their current medications and supplements 4. The patient's functional ability including ADL's, fall risks, home safety risks and hearing or visual impairment. 5. Diet and physical activities 6. Evidence for depression or mood disorders    Past Medical History  Diagnosis Date  . Low blood pressure   . Osteoarthritis     THUMB  . ALLERGIC RHINITIS   . FIBROCYSTIC BREAST DISEASE   . Osteopenia   . CIN I (cervical intraepithelial neoplasia I)   . Tinnitus of both ears     L>R, follows with ENT for same   Family History  Problem Relation Age of Onset  . Diverticulosis Father   . Lung cancer Father     brain mets  . Colon cancer Neg Hx   . Hyperlipidemia Mother   . Thyroid disease Mother   . Breast cancer Paternal Grandmother    History  Substance Use Topics  . Smoking status: Never Smoker   . Smokeless tobacco: Never Used  . Alcohol Use: 8.4 oz/week    14 Glasses of wine per week    Review of Systems  Constitutional: Negative for fatigue and unexpected weight change.  Respiratory: Negative for cough, shortness of breath and wheezing.   Cardiovascular: Negative for chest pain, palpitations and leg swelling.  Gastrointestinal: Negative for nausea, abdominal pain and diarrhea.  Neurological: Negative for dizziness, weakness,  light-headedness and headaches.  Psychiatric/Behavioral: Negative for dysphoric mood. The patient is not nervous/anxious.   All other systems reviewed and are negative.      Objective:    Physical Exam  Constitutional: She appears well-developed and well-nourished. No distress.  Cardiovascular: Normal rate, regular rhythm and normal heart sounds.   No murmur heard. Pulmonary/Chest: Effort normal and breath sounds normal. No respiratory distress.  Musculoskeletal: She exhibits no edema.  Back: full range of motion of thoracic and lumbar spine. Non tender to palpation. Negative straight leg raise. DTR's are symmetrically intact. Sensation intact in all dermatomes of the lower extremities. Full strength to manual muscle testing. patient is able to heel toe walk without difficulty and ambulates with antalgic gait.    BP 112/58 mmHg  Pulse 67  Temp(Src) 97.7 F (36.5 C) (Oral)  Wt 138 lb (62.596 kg)  SpO2 98% Wt Readings from Last 3 Encounters:  04/17/14 138 lb (62.596 kg)  12/20/13 130 lb (58.968 kg)  02/06/13 132 lb (59.875 kg)     Lab Results  Component Value Date   WBC 6.1 02/06/2013   HGB 14.5 02/06/2013   HCT 42.9 02/06/2013   PLT 250.0 02/06/2013   GLUCOSE 99 02/06/2013   CHOL 170 02/06/2013   TRIG 32.0 02/06/2013   HDL 63.30 02/06/2013   LDLCALC 100* 02/06/2013   ALT 21 02/06/2013   AST 19 02/06/2013   NA 136 02/06/2013   K 3.9 02/06/2013   CL 103 02/06/2013  CREATININE 0.6 02/06/2013   BUN 23 02/06/2013   CO2 27 02/06/2013   TSH 2.62 02/06/2013    Mm Screening Breast Tomo Bilateral  12/27/2013   CLINICAL DATA:  Screening.  EXAM: DIGITAL SCREENING BILATERAL MAMMOGRAM WITH 3D TOMO WITH CAD  COMPARISON:  Previous exam(s).  ACR Breast Density Category c: The breast tissue is heterogeneously dense, which may obscure small masses.  FINDINGS: There are no findings suspicious for malignancy. Images were processed with CAD.  IMPRESSION: No mammographic evidence of  malignancy. A result letter of this screening mammogram will be mailed directly to the patient.  RECOMMENDATION: Screening mammogram in one year. (Code:[id number]-01Y)  BI-RADS CATEGORY  1: Negative.   Electronically Signed   By: Everlean Alstrom M.D.   On: 12/27/2013 14:09       Assessment & Plan:   AWV/z00.00 - Today patient counseled on age appropriate routine health concerns for screening and prevention, each reviewed and up to date or declined. Immunizations reviewed and up to date or declined. Labs ordered and reviewed. Risk factors for depression reviewed and negative. Hearing function and visual acuity are intact. ADLs screened and addressed as needed. Functional ability and level of safety reviewed and appropriate. Education, counseling and referrals performed based on assessed risks today. Patient provided with a copy of personalized plan for preventive services.  Problem List Items Addressed This Visit    Osteopenia    DEXA March 17, 2011 gynecology with osteopenia Stable on DEXA 03/2013 Remotely on Boniva - discontinued same due to concern for potential long-term side effects Also declines additional calcium or vitamin D supplementation Check Vit D now Ongoing weight bearing activity with walking 4 miles 3-5 times weekly Recheck DEXA Feb 2017, patient requests [facility] hospital Continue supportive care, patient education provided      Relevant Orders   Vit D  25 hydroxy (rtn osteoporosis monitoring)    Other Visit Diagnoses    Routine general medical examination at a health care facility    -  Primary    Relevant Orders    Basic metabolic panel    CBC with Differential/Platelet    Hepatic function panel    Lipid panel    TSH    Urinalysis, Routine w reflex microscopic        Gwendolyn Grant, MD

## 2014-04-17 NOTE — Assessment & Plan Note (Signed)
DEXA March 17, 2011 gynecology with osteopenia Stable on DEXA 03/2013 Remotely on Boniva - discontinued same due to concern for potential long-term side effects Also declines additional calcium or vitamin D supplementation Check Vit D now Ongoing weight bearing activity with walking 4 miles 3-5 times weekly Recheck DEXA Feb 2017, patient requests [facility] hospital Continue supportive care, patient education provided

## 2014-04-17 NOTE — Patient Instructions (Addendum)
It was good to see you today.  We have reviewed your prior records including labs and tests today  Health Maintenance reviewed - all recommended immunizations and age-appropriate screenings are up-to-date.  Test(s) ordered today. Your results will be released to [place] (or called to you) after review, usually within 72hours after test completion. If any changes need to be made, you will be notified at that same time.  Medications reviewed and updated, no changes recommended at this time.  Please schedule followup in 12 months for annual exam and labs, call sooner if problems.  Health Maintenance Adopting a healthy lifestyle and getting preventive care can go a long way to promote health and wellness. Talk with your health care provider about what schedule of regular examinations is right for you. This is a good chance for you to check in with your provider about disease prevention and staying healthy. In between checkups, there are plenty of things you can do on your own. Experts have done a lot of research about which lifestyle changes and preventive measures are most likely to keep you healthy. Ask your health care provider for more information. WEIGHT AND DIET  Eat a healthy diet  Be sure to include plenty of vegetables, fruits, low-fat dairy products, and lean protein.  Do not eat a lot of foods high in solid fats, added sugars, or salt.  Get regular exercise. This is one of the most important things you can do for your health.  Most adults should exercise for at least 150 minutes each week. The exercise should increase your heart rate and make you sweat (moderate-intensity exercise).  Most adults should also do strengthening exercises at least twice a week. This is in addition to the moderate-intensity exercise.  Maintain a healthy weight  Body mass index (BMI) is a measurement that can be used to identify possible weight problems. It estimates body fat based on height and weight.  Your health care provider can help determine your BMI and help you achieve or maintain a healthy weight.  For females 20 years of age and older:   A BMI below 18.5 is considered underweight.  A BMI of 18.5 to 24.9 is normal.  A BMI of 25 to 29.9 is considered overweight.  A BMI of 30 and above is considered obese.  Watch levels of cholesterol and blood lipids  You should start having your blood tested for lipids and cholesterol at 69 years of age, then have this test every 5 years.  You may need to have your cholesterol levels checked more often if:  Your lipid or cholesterol levels are high.  You are older than 69 years of age.  You are at high risk for heart disease.  CANCER SCREENING   Lung Cancer  Lung cancer screening is recommended for adults 1-59 years old who are at high risk for lung cancer because of a history of smoking.  A yearly low-dose CT scan of the lungs is recommended for people who:  Currently smoke.  Have quit within the past 15 years.  Have at least a 30-pack-year history of smoking. A pack year is smoking an average of one pack of cigarettes a day for 1 year.  Yearly screening should continue until it has been 15 years since you quit.  Yearly screening should stop if you develop a health problem that would prevent you from having lung cancer treatment.  Breast Cancer  Practice breast self-awareness. This means understanding how your breasts normally appear and  feel.  It also means doing regular breast self-exams. Let your health care provider know about any changes, no matter how small.  If you are in your 20s or 30s, you should have a clinical breast exam (CBE) by a health care provider every 1-3 years as part of a regular health exam.  If you are 40 or older, have a CBE every year. Also consider having a breast X-ray (mammogram) every year.  If you have a family history of breast cancer, talk to your health care provider about genetic  screening.  If you are at high risk for breast cancer, talk to your health care provider about having an MRI and a mammogram every year.  Breast cancer gene (BRCA) assessment is recommended for women who have family members with BRCA-related cancers. BRCA-related cancers include:  Breast.  Ovarian.  Tubal.  Peritoneal cancers.  Results of the assessment will determine the need for genetic counseling and BRCA1 and BRCA2 testing. Cervical Cancer Routine pelvic examinations to screen for cervical cancer are no longer recommended for nonpregnant women who are considered low risk for cancer of the pelvic organs (ovaries, uterus, and vagina) and who do not have symptoms. A pelvic examination may be necessary if you have symptoms including those associated with pelvic infections. Ask your health care provider if a screening pelvic exam is right for you.   The Pap test is the screening test for cervical cancer for women who are considered at risk.  If you had a hysterectomy for a problem that was not cancer or a condition that could lead to cancer, then you no longer need Pap tests.  If you are older than 65 years, and you have had normal Pap tests for the past 10 years, you no longer need to have Pap tests.  If you have had past treatment for cervical cancer or a condition that could lead to cancer, you need Pap tests and screening for cancer for at least 20 years after your treatment.  If you no longer get a Pap test, assess your risk factors if they change (such as having a new sexual partner). This can affect whether you should start being screened again.  Some women have medical problems that increase their chance of getting cervical cancer. If this is the case for you, your health care provider may recommend more frequent screening and Pap tests.  The human papillomavirus (HPV) test is another test that may be used for cervical cancer screening. The HPV test looks for the virus that can  cause cell changes in the cervix. The cells collected during the Pap test can be tested for HPV.  The HPV test can be used to screen women 30 years of age and older. Getting tested for HPV can extend the interval between normal Pap tests from three to five years.  An HPV test also should be used to screen women of any age who have unclear Pap test results.  After 69 years of age, women should have HPV testing as often as Pap tests.  Colorectal Cancer  This type of cancer can be detected and often prevented.  Routine colorectal cancer screening usually begins at 69 years of age and continues through 69 years of age.  Your health care provider may recommend screening at an earlier age if you have risk factors for colon cancer.  Your health care provider may also recommend using home test kits to check for hidden blood in the stool.  A small camera   at the end of a tube can be used to examine your colon directly (sigmoidoscopy or colonoscopy). This is done to check for the earliest forms of colorectal cancer.  Routine screening usually begins at age 50.  Direct examination of the colon should be repeated every 5-10 years through 69 years of age. However, you may need to be screened more often if early forms of precancerous polyps or small growths are found. Skin Cancer  Check your skin from head to toe regularly.  Tell your health care provider about any new moles or changes in moles, especially if there is a change in a mole's shape or color.  Also tell your health care provider if you have a mole that is larger than the size of a pencil eraser.  Always use sunscreen. Apply sunscreen liberally and repeatedly throughout the day.  Protect yourself by wearing long sleeves, pants, a wide-brimmed hat, and sunglasses whenever you are outside. HEART DISEASE, DIABETES, AND HIGH BLOOD PRESSURE   Have your blood pressure checked at least every 1-2 years. High blood pressure causes heart  disease and increases the risk of stroke.  If you are between 55 years and 79 years old, ask your health care provider if you should take aspirin to prevent strokes.  Have regular diabetes screenings. This involves taking a blood sample to check your fasting blood sugar level.  If you are at a normal weight and have a low risk for diabetes, have this test once every three years after 69 years of age.  If you are overweight and have a high risk for diabetes, consider being tested at a younger age or more often. PREVENTING INFECTION  Hepatitis B  If you have a higher risk for hepatitis B, you should be screened for this virus. You are considered at high risk for hepatitis B if:  You were born in a country where hepatitis B is common. Ask your health care provider which countries are considered high risk.  Your parents were born in a high-risk country, and you have not been immunized against hepatitis B (hepatitis B vaccine).  You have HIV or AIDS.  You use needles to inject street drugs.  You live with someone who has hepatitis B.  You have had sex with someone who has hepatitis B.  You get hemodialysis treatment.  You take certain medicines for conditions, including cancer, organ transplantation, and autoimmune conditions. Hepatitis C  Blood testing is recommended for:  Everyone born from 1945 through 1965.  Anyone with known risk factors for hepatitis C. Sexually transmitted infections (STIs)  You should be screened for sexually transmitted infections (STIs) including gonorrhea and chlamydia if:  You are sexually active and are younger than 69 years of age.  You are older than 69 years of age and your health care provider tells you that you are at risk for this type of infection.  Your sexual activity has changed since you were last screened and you are at an increased risk for chlamydia or gonorrhea. Ask your health care provider if you are at risk.  If you do not have  HIV, but are at risk, it may be recommended that you take a prescription medicine daily to prevent HIV infection. This is called pre-exposure prophylaxis (PrEP). You are considered at risk if:  You are sexually active and do not regularly use condoms or know the HIV status of your partner(s).  You take drugs by injection.  You are sexually active with a partner   who has HIV. Talk with your health care provider about whether you are at high risk of being infected with HIV. If you choose to begin PrEP, you should first be tested for HIV. You should then be tested every 3 months for as long as you are taking PrEP.  PREGNANCY   If you are premenopausal and you may become pregnant, ask your health care provider about preconception counseling.  If you may become pregnant, take 400 to 800 micrograms (mcg) of folic acid every day.  If you want to prevent pregnancy, talk to your health care provider about birth control (contraception). OSTEOPOROSIS AND MENOPAUSE   Osteoporosis is a disease in which the bones lose minerals and strength with aging. This can result in serious bone fractures. Your risk for osteoporosis can be identified using a bone density scan.  If you are 65 years of age or older, or if you are at risk for osteoporosis and fractures, ask your health care provider if you should be screened.  Ask your health care provider whether you should take a calcium or vitamin D supplement to lower your risk for osteoporosis.  Menopause may have certain physical symptoms and risks.  Hormone replacement therapy may reduce some of these symptoms and risks. Talk to your health care provider about whether hormone replacement therapy is right for you.  HOME CARE INSTRUCTIONS   Schedule regular health, dental, and eye exams.  Stay current with your immunizations.   Do not use any tobacco products including cigarettes, chewing tobacco, or electronic cigarettes.  If you are pregnant, do not  drink alcohol.  If you are breastfeeding, limit how much and how often you drink alcohol.  Limit alcohol intake to no more than 1 drink per day for nonpregnant women. One drink equals 12 ounces of beer, 5 ounces of wine, or 1 ounces of hard liquor.  Do not use street drugs.  Do not share needles.  Ask your health care provider for help if you need support or information about quitting drugs.  Tell your health care provider if you often feel depressed.  Tell your health care provider if you have ever been abused or do not feel safe at home. Document Released: 08/24/2010 Document Revised: 06/25/2013 Document Reviewed: 01/10/2013 [employer] Patient Information 2015 [employer]. This information is not intended to replace advice given to you by your health care provider. Make sure you discuss any questions you have with your health care provider.  

## 2014-11-25 ENCOUNTER — Other Ambulatory Visit: Payer: Self-pay

## 2014-11-25 DIAGNOSIS — Z1231 Encounter for screening mammogram for malignant neoplasm of breast: Secondary | ICD-10-CM

## 2015-01-07 ENCOUNTER — Ambulatory Visit
Admission: RE | Admit: 2015-01-07 | Discharge: 2015-01-07 | Disposition: A | Discharge status: Home / Self Care | Payer: PPO | Source: Ambulatory Visit

## 2015-01-07 DIAGNOSIS — Z1231 Encounter for screening mammogram for malignant neoplasm of breast: Secondary | ICD-10-CM

## 2015-02-26 ENCOUNTER — Encounter: Payer: Self-pay | Admitting: Internal Medicine

## 2015-02-26 ENCOUNTER — Ambulatory Visit (INDEPENDENT_AMBULATORY_CARE_PROVIDER_SITE_OTHER): Payer: PPO | Admitting: Internal Medicine

## 2015-02-26 VITALS — BP 116/76 | Temp 98.0°F | Ht 62.0 in | Wt 137.0 lb

## 2015-02-26 DIAGNOSIS — Z7689 Persons encountering health services in other specified circumstances: Secondary | ICD-10-CM

## 2015-02-26 DIAGNOSIS — J302 Other seasonal allergic rhinitis: Secondary | ICD-10-CM

## 2015-02-26 DIAGNOSIS — M8580 Other specified disorders of bone density and structure, unspecified site: Secondary | ICD-10-CM | POA: Diagnosis not present

## 2015-02-26 DIAGNOSIS — Z7189 Other specified counseling: Secondary | ICD-10-CM | POA: Diagnosis not present

## 2015-02-26 NOTE — Progress Notes (Signed)
Pre visit review using our clinic review tool, if applicable. No additional management support is needed unless otherwise documented below in the visit note.   Chief Complaint  Patient presents with  . Establish Care    HPI: Patient  Cynthia Miller  70 y.o. comes in today for establshing  new PCP  Health Care visit   Transfer of care from Dr Asa Lente  Leaving clinical care .   Originally from [place]  in [place] area over 30 years  Ba degree . Basically retired dose  Some prn work.  Generally healthy but has osteopenia   And at one point years ago took boniva for 2-3 years given by her gyne at the time  No hx of fragility fracture  And npo fam hx of  Hip fracture but  Mom passed age 56.  She exercises  But had problems with foot  Has had ll discrepancy  an had  Right foot surgery right  Years in the past.  Allergic rhinitis    otc meds currently not problematic  Health Maintenance  Topic Date Due  . Hepatitis C Screening  02/26/2016 (Originally 21-Aug-1945)  . INFLUENZA VACCINE  09/23/2015  . MAMMOGRAM  01/06/2017  . TETANUS/TDAP  11/06/2017  . COLONOSCOPY  07/22/2020  . DEXA SCAN  Completed  . ZOSTAVAX  Completed  . PNA vac Low Risk Adult  Completed   Health Maintenance Review LIFESTYLE:  Exercise:  Exercise well. Walks   [place]  Tobacco/ETS: no Alcohol: 1 day  Sugar beverages: Sleep: 6-7  Drug use: no  ROS:  GEN/ HEENT: No fever, significant weight changes sweats headaches vision problems hearing changes, CV/ PULM; No chest pain shortness of breath cough, syncope,edema  change in exercise tolerance. GI /GU: No adominal pain, vomiting, change in bowel habits. No blood in the stool. No significant GU symptoms. SKIN/HEME: ,no acute skin rashes suspicious lesions or bleeding. No lymphadenopathy, nodules, masses.  NEURO/ PSYCH:  No neurologic signs such as weakness numbness. No depression anxiety. IMM/ Allergy: No unusual infections.  Allergy .   REST of 12 system review  negative except as per HPI   Past Medical History  Diagnosis Date  . Low blood pressure   . Osteoarthritis     THUMB  . ALLERGIC RHINITIS   . FIBROCYSTIC BREAST DISEASE   . Osteopenia   . CIN I (cervical intraepithelial neoplasia I)   . Tinnitus of both ears     L>R, follows with ENT for same  . Hx of varicella     Past Surgical History  Procedure Laterality Date  . Colonoscopy    . Right foot      bednarz  . Breast cyst aspiration    . Laparoscopic tubal ligation    . Tubal ligation    . Colposcopy    . Cervical cone biopsy      Family History  Problem Relation Age of Onset  . Diverticulosis Father   . Lung cancer Father     brain mets,did smoke  . Colon cancer Neg Hx   . Hyperlipidemia Mother     died age 94  . Thyroid disease Mother   . Breast cancer Paternal Grandmother   . Healthy Brother     Social History   Social History  . Marital Status: Married    Spouse Name: N/A  . Number of Children: 1  . Years of Education: 16   Occupational History  . volunteer    Social History  Main Topics  . Smoking status: Never Smoker   . Smokeless tobacco: Never Used  . Alcohol Use: 8.4 oz/week    14 Glasses of wine per week  . Drug Use: No  . Sexual Activity:    Partners: Male    Birth Control/ Protection: Surgical   Other Topics Concern  . None   Social History Narrative   [employer], [employer] in [place] to finish BA Art history. Married 1969 - 10 yr/divorce. Remarried '83 - 10 yrs/divorced. Remarried '97 -. 1 dtr - '74. 2 grandchildren. Work - volunteering. Marriage in good health.     Outpatient Prescriptions Prior to Visit  Medication Sig Dispense Refill  . levocetirizine (XYZAL) 5 MG tablet Take 1 tablet (5 mg total) by mouth every evening.    . montelukast (SINGULAIR) 10 MG tablet Take 1 tablet (10 mg total) by mouth at bedtime.    Marland Maffett loratadine-pseudoephedrine (CLARITIN-D 24-HOUR) 10-240 MG per 24 hr tablet Take 1 tablet by mouth  daily.       No facility-administered medications prior to visit.     EXAM:  BP 116/76 mmHg  Temp(Src) 98 F (36.7 C) (Oral)  Ht 5\' 2"  (1.575 m)  Wt 137 lb (62.143 kg)  BMI 25.05 kg/m2  Body mass index is 25.05 kg/(m^2).  Physical Exam: Vital signs reviewed [id number] is a well-developed well-nourished alert cooperative    who appearsr stated age in no acute distress.  HEENT: normocephalic atraumatic , Eyes: PERRL EOM's full, conjunctiva clear,NECK: supple without masses, thyromegaly or bruits. CHEST/PULM:  Clear to auscultation and percussion breath sounds equal no wheeze , rales or rhonchi.  CV: PMI is nondisplaced, S1 S2 no gallops, murmurs, rubs. Peripheral pulses are full without delay.No JVD .  ABDOMEN: Bowel sounds normal nontender  No guard or rebound, no hepato splenomegal no CVA tenderness.  Extremtities:  No clubbing cyanosis or edema, no acute joint swelling or redness no focal atrophy NEURO:  Oriented x3, cranial nerves 3-12 appear to be grossly  intact, no obvious focal weakness, SKIN: No acute rashes normal turgor, color, no bruising or petechiae. PSYCH: Oriented, good eye contact, no obvious depression anxiety, cognition and judgment appear normal. LN: no cervical adenopathy  Lab Results  Component Value Date   WBC 5.3 04/17/2014   HGB 14.9 04/17/2014   HCT 43.7 04/17/2014   PLT 235.0 04/17/2014   GLUCOSE 103* 04/17/2014   CHOL 174 04/17/2014   TRIG 40.0 04/17/2014   HDL 70.70 04/17/2014   LDLCALC 95 04/17/2014   ALT 24 04/17/2014   AST 19 04/17/2014   NA 139 04/17/2014   K 4.1 04/17/2014   CL 106 04/17/2014   CREATININE 0.61 04/17/2014   BUN 17 04/17/2014   CO2 28 04/17/2014   TSH 1.97 04/17/2014   reviewed  Data scan and lab  w patient ASSESSMENT AND PLAN:  Discussed the following assessment and plan:  Osteopenia - revewied  disc follow few years long time ago with boniva reassess cpx /wellness with fu  - Plan: DG Bone Density  Encounter to  establish care with new doctor  Allergic rhinitis, seasonal Total visit 30mins > 50% spent counseling and coordinating care as indicated in above note and in instructions to patient .   Patient Care Team: Burnis Medin, MD as PCP - General (Internal Medicine) Irene Shipper, MD (Gastroenterology) Renee Pain, MD (Plastic Surgery) Jari Pigg, MD (Dermatology) Stefanie Libel, MD (Sports Medicine) Mosetta Anis, MD (Allergy) Leta Baptist, MD (  Otolaryngology) Patient Instructions  Optimize bone health  Prevention. .   Exercise and     Weight bearing .  Get a dexa scan    .  Plan    Wellness  cpx  In about 3-4 months  With labs              Bone Health Bones protect organs, store calcium, and anchor muscles. Good health habits, such as eating nutritious foods and exercising regularly, are important for maintaining healthy bones. They can also help to prevent a condition that causes bones to lose density and become weak and brittle (osteoporosis). WHY IS BONE MASS IMPORTANT? Bone mass refers to the amount of bone tissue that you have. The higher your bone mass, the stronger your bones. An important step toward having healthy bones throughout life is to have strong and dense bones during childhood. A young adult who has a high bone mass is more likely to have a high bone mass later in life. Bone mass at its greatest it is called peak bone .mass. A large decline in bone mass occurs in older adults. In women, it occurs about the time of menopause. During this time, it is important to practice good health habits, because if more bone is lost than what is replaced, the bones will become less healthy and more likely to break (fracture). If you find that you have a low bone mass, you may be able to prevent osteoporosis or further bone loss by changing your diet and lifestyle. HOW CAN I FIND OUT IF MY BONE MASS IS LOW? Bone mass can be measured with an X-ray test that is called a bone mineral density  (BMD) test. This test is recommended for all women who are age 55 or older. It may also be recommended for men who are age 28 or older, or for people who are more likely to develop osteoporosis due to:  Having bones that break easily.  Having a long-term disease that weakens bones, such as kidney disease or rheumatoid arthritis.  Having menopause earlier than normal.  Taking medicine that weakens bones, such as steroids, thyroid hormones, or hormone treatment for breast cancer or prostate cancer.  Smoking.  Drinking three or more alcoholic drinks each day. WHAT ARE THE NUTRITIONAL RECOMMENDATIONS FOR HEALTHY BONES? To have healthy bones, you need to get enough of the right minerals and vitamins. Most nutrition experts recommend getting these nutrients from the foods that you eat. Nutritional recommendations vary from person to person. Ask your health care provider what is healthy for you. Here are some general guidelines. Calcium Recommendations Calcium is the most important (essential) mineral for bone health. Most people can get enough calcium from their diet, but supplements may be recommended for people who are at risk for osteoporosis. Good sources of calcium include:  Dairy products, such as low-fat or nonfat milk, cheese, and yogurt.  Dark green leafy vegetables, such as bok choy and broccoli.  Calcium-fortified foods, such as orange juice, cereal, bread, soy beverages, and tofu products.  Nuts, such as almonds. Follow these recommended amounts for daily calcium intake:  Children, age 88-3: 700 mg.  Children, age 56-8: 1,000 mg.  Children, age 63-13: 1,300 mg.  Teens, age 71-18: 1,300 mg.  Adults, age 53-50: 1,000 mg.  Adults, age 29-70:  Men: 1,000 mg.  Women: 1,200 mg.  Adults, age 61 or older: 1,200 mg.  Pregnant and breastfeeding females:  Teens: 1,300 mg.  Adults: 1,000 mg. Vitamin D Recommendations  Vitamin D is the most essential vitamin for bone health.  It helps the body to absorb calcium. Sunlight stimulates the skin to make vitamin D, so be sure to get enough sunlight. If you live in a cold climate or you do not get outside often, your health care provider may recommend that you take vitamin D supplements. Good sources of vitamin D in your diet include:  Egg yolks.  Saltwater fish.  Milk and cereal fortified with vitamin D. Follow these recommended amounts for daily vitamin D intake:  Children and teens, age 37-18: 600 international units.  Adults, age 377 or younger: 400-800 international units.  Adults, age 91 or older: 800-1,000 international units. Other Nutrients Other nutrients for bone health include:  Phosphorus. This mineral is found in meat, poultry, dairy foods, nuts, and legumes. The recommended daily intake for adult men and adult women is 700 mg.  Magnesium. This mineral is found in seeds, nuts, dark green vegetables, and legumes. The recommended daily intake for adult men is 400-420 mg. For adult women, it is 310-320 mg.  Vitamin K. This vitamin is found in green leafy vegetables. The recommended daily intake is 120 mg for adult men and 90 mg for adult women. WHAT TYPE OF PHYSICAL ACTIVITY IS BEST FOR BUILDING AND MAINTAINING HEALTHY BONES? Weight-bearing and strength-building activities are important for building and maintaining peak bone mass. Weight-bearing activities cause muscles and bones to work against gravity. Strength-building activities increases muscle strength that supports bones. Weight-bearing and muscle-building activities include:  Walking and hiking.  Jogging and running.  Dancing.  Gym exercises.  Lifting weights.  Tennis and racquetball.  Climbing stairs.  Aerobics. Adults should get at least 30 minutes of moderate physical activity on most days. Children should get at least 60 minutes of moderate physical activity on most days. Ask your health care provide what type of exercise is best for  you. WHERE CAN I FIND MORE INFORMATION? For more information, check out the following websites:  [place]: [employer].se  [employer] of Health: http://www.niams.AnonymousEar.fr.asp   This information is not intended to replace advice given to you by your health care provider. Make sure you discuss any questions you have with your health care provider.   Document Released: 05/01/2003 Document Revised: 06/25/2014 Document Reviewed: 02/13/2014 Elsevier Interactive Patient Education 2016 [place] K. Doye Montilla M.D.

## 2015-02-26 NOTE — Patient Instructions (Signed)
Optimize bone health  Prevention. .   Exercise and     Weight bearing .  Get a dexa scan    .  Plan    Wellness  cpx  In about 3-4 months  With labs              Bone Health Bones protect organs, store calcium, and anchor muscles. Good health habits, such as eating nutritious foods and exercising regularly, are important for maintaining healthy bones. They can also help to prevent a condition that causes bones to lose density and become weak and brittle (osteoporosis). WHY IS BONE MASS IMPORTANT? Bone mass refers to the amount of bone tissue that you have. The higher your bone mass, the stronger your bones. An important step toward having healthy bones throughout life is to have strong and dense bones during childhood. A young adult who has a high bone mass is more likely to have a high bone mass later in life. Bone mass at its greatest it is called peak bone .mass. A large decline in bone mass occurs in older adults. In women, it occurs about the time of menopause. During this time, it is important to practice good health habits, because if more bone is lost than what is replaced, the bones will become less healthy and more likely to break (fracture). If you find that you have a low bone mass, you may be able to prevent osteoporosis or further bone loss by changing your diet and lifestyle. HOW CAN I FIND OUT IF MY BONE MASS IS LOW? Bone mass can be measured with an X-ray test that is called a bone mineral density (BMD) test. This test is recommended for all women who are age 87 or older. It may also be recommended for men who are age 618 or older, or for people who are more likely to develop osteoporosis due to:  Having bones that break easily.  Having a long-term disease that weakens bones, such as kidney disease or rheumatoid arthritis.  Having menopause earlier than normal.  Taking medicine that weakens bones, such as steroids, thyroid hormones, or hormone treatment for breast cancer or  prostate cancer.  Smoking.  Drinking three or more alcoholic drinks each day. WHAT ARE THE NUTRITIONAL RECOMMENDATIONS FOR HEALTHY BONES? To have healthy bones, you need to get enough of the right minerals and vitamins. Most nutrition experts recommend getting these nutrients from the foods that you eat. Nutritional recommendations vary from person to person. Ask your health care provider what is healthy for you. Here are some general guidelines. Calcium Recommendations Calcium is the most important (essential) mineral for bone health. Most people can get enough calcium from their diet, but supplements may be recommended for people who are at risk for osteoporosis. Good sources of calcium include:  Dairy products, such as low-fat or nonfat milk, cheese, and yogurt.  Dark green leafy vegetables, such as bok choy and broccoli.  Calcium-fortified foods, such as orange juice, cereal, bread, soy beverages, and tofu products.  Nuts, such as almonds. Follow these recommended amounts for daily calcium intake:  Children, age 31-3: 700 mg.  Children, age 61-8: 1,000 mg.  Children, age 55-13: 1,300 mg.  Teens, age 5-18: 1,300 mg.  Adults, age 71-50: 1,000 mg.  Adults, age 81-70:  Men: 1,000 mg.  Women: 1,200 mg.  Adults, age 81 or older: 1,200 mg.  Pregnant and breastfeeding females:  Teens: 1,300 mg.  Adults: 1,000 mg. Vitamin D Recommendations Vitamin D is the  most essential vitamin for bone health. It helps the body to absorb calcium. Sunlight stimulates the skin to make vitamin D, so be sure to get enough sunlight. If you live in a cold climate or you do not get outside often, your health care provider may recommend that you take vitamin D supplements. Good sources of vitamin D in your diet include:  Egg yolks.  Saltwater fish.  Milk and cereal fortified with vitamin D. Follow these recommended amounts for daily vitamin D intake:  Children and teens, age 2-18: 600  international units.  Adults, age 42 or younger: 400-800 international units.  Adults, age 66 or older: 800-1,000 international units. Other Nutrients Other nutrients for bone health include:  Phosphorus. This mineral is found in meat, poultry, dairy foods, nuts, and legumes. The recommended daily intake for adult men and adult women is 700 mg.  Magnesium. This mineral is found in seeds, nuts, dark green vegetables, and legumes. The recommended daily intake for adult men is 400-420 mg. For adult women, it is 310-320 mg.  Vitamin K. This vitamin is found in green leafy vegetables. The recommended daily intake is 120 mg for adult men and 90 mg for adult women. WHAT TYPE OF PHYSICAL ACTIVITY IS BEST FOR BUILDING AND MAINTAINING HEALTHY BONES? Weight-bearing and strength-building activities are important for building and maintaining peak bone mass. Weight-bearing activities cause muscles and bones to work against gravity. Strength-building activities increases muscle strength that supports bones. Weight-bearing and muscle-building activities include:  Walking and hiking.  Jogging and running.  Dancing.  Gym exercises.  Lifting weights.  Tennis and racquetball.  Climbing stairs.  Aerobics. Adults should get at least 30 minutes of moderate physical activity on most days. Children should get at least 60 minutes of moderate physical activity on most days. Ask your health care provide what type of exercise is best for you. WHERE CAN I FIND MORE INFORMATION? For more information, check out the following websites:  [place]: [employer]se  [employer] of Health: http://www.niams.AnonymousEar.fr.asp   This information is not intended to replace advice given to you by your health care provider. Make sure you discuss any questions you have with your health care provider.   Document Released: 05/01/2003  Document Revised: 06/25/2014 Document Reviewed: 02/13/2014 Elsevier Interactive Patient Education [employer].

## 2015-03-03 ENCOUNTER — Encounter: Payer: Self-pay | Admitting: Internal Medicine

## 2015-03-20 DIAGNOSIS — D485 Neoplasm of uncertain behavior of skin: Secondary | ICD-10-CM | POA: Diagnosis not present

## 2015-03-20 DIAGNOSIS — D225 Melanocytic nevi of trunk: Secondary | ICD-10-CM | POA: Diagnosis not present

## 2015-04-01 ENCOUNTER — Ambulatory Visit (INDEPENDENT_AMBULATORY_CARE_PROVIDER_SITE_OTHER)
Admission: RE | Admit: 2015-04-01 | Discharge: 2015-04-01 | Disposition: A | Discharge status: Home / Self Care | Payer: PPO | Source: Ambulatory Visit | Attending: Internal Medicine | Admitting: Internal Medicine

## 2015-04-01 DIAGNOSIS — M8580 Other specified disorders of bone density and structure, unspecified site: Secondary | ICD-10-CM | POA: Diagnosis not present

## 2015-04-09 ENCOUNTER — Other Ambulatory Visit: Payer: Self-pay | Admitting: Family Medicine

## 2015-04-09 DIAGNOSIS — Z0184 Encounter for antibody response examination: Secondary | ICD-10-CM

## 2015-04-09 DIAGNOSIS — Z0000 Encounter for general adult medical examination without abnormal findings: Secondary | ICD-10-CM

## 2015-04-21 ENCOUNTER — Encounter: Payer: PPO | Admitting: Internal Medicine

## 2015-04-21 DIAGNOSIS — J3089 Other allergic rhinitis: Secondary | ICD-10-CM | POA: Diagnosis not present

## 2015-04-24 DIAGNOSIS — H0014 Chalazion left upper eyelid: Secondary | ICD-10-CM | POA: Diagnosis not present

## 2015-07-15 ENCOUNTER — Other Ambulatory Visit (INDEPENDENT_AMBULATORY_CARE_PROVIDER_SITE_OTHER): Payer: PPO

## 2015-07-15 DIAGNOSIS — Z0000 Encounter for general adult medical examination without abnormal findings: Secondary | ICD-10-CM | POA: Diagnosis not present

## 2015-07-15 DIAGNOSIS — Z0184 Encounter for antibody response examination: Secondary | ICD-10-CM | POA: Diagnosis not present

## 2015-07-15 LAB — BASIC METABOLIC PANEL
BUN: 19 mg/dL (ref 6–23)
CALCIUM: 9.4 mg/dL (ref 8.4–10.5)
CO2: 27 mEq/L (ref 19–32)
CREATININE: 0.67 mg/dL (ref 0.40–1.20)
Chloride: 106 mEq/L (ref 96–112)
GFR: 92.39 mL/min (ref >60.00)
GLUCOSE: 99 mg/dL (ref 70–99)
POTASSIUM: 4.2 meq/L (ref 3.5–5.1)
Sodium: 140 mEq/L (ref 135–145)

## 2015-07-15 LAB — CBC WITH DIFFERENTIAL/PLATELET
Basophils Absolute: 0 10*3/uL (ref 0.0–0.1)
Basophils Relative: 0.6 % (ref 0.0–3.0)
EOS ABS: 0.1 10*3/uL (ref 0.0–0.7)
EOS PCT: 1.8 % (ref 0.0–5.0)
HCT: 42.4 % (ref 36.0–46.0)
HEMOGLOBIN: 14.4 g/dL (ref 12.0–15.0)
LYMPHS ABS: 1.9 10*3/uL (ref 0.7–4.0)
Lymphocytes Relative: 44.4 % (ref 12.0–46.0)
MCHC: 33.9 g/dL (ref 30.0–36.0)
MCV: 90.8 fl (ref 78.0–100.0)
MONO ABS: 0.4 10*3/uL (ref 0.1–1.0)
Monocytes Relative: 10.4 % (ref 3.0–12.0)
NEUTROS PCT: 42.8 % — AB (ref 43.0–77.0)
Neutro Abs: 1.8 10*3/uL (ref 1.4–7.7)
Platelets: 232 10*3/uL (ref 150.0–400.0)
RBC: 4.67 Mil/uL (ref 3.87–5.11)
RDW: 14.1 % (ref 11.5–15.5)
WBC: 4.3 10*3/uL (ref 4.0–10.5)

## 2015-07-15 LAB — TSH: TSH: 3.58 u[IU]/mL (ref 0.35–4.50)

## 2015-07-15 LAB — LIPID PANEL
CHOLESTEROL: 160 mg/dL (ref 0–200)
HDL: 53.1 mg/dL (ref >39.00)
LDL Cholesterol: 98 mg/dL (ref 0–99)
NonHDL: 107.06
TRIGLYCERIDES: 44 mg/dL (ref 0.0–149.0)
Total CHOL/HDL Ratio: 3
VLDL: 8.8 mg/dL (ref 0.0–40.0)

## 2015-07-15 LAB — HEPATIC FUNCTION PANEL
ALBUMIN: 4.1 g/dL (ref 3.5–5.2)
ALK PHOS: 78 U/L (ref 39–117)
ALT: 18 U/L (ref 0–35)
AST: 15 U/L (ref 0–37)
BILIRUBIN TOTAL: 0.5 mg/dL (ref 0.2–1.2)
Bilirubin, Direct: 0.1 mg/dL (ref 0.0–0.3)
Total Protein: 6.7 g/dL (ref 6.0–8.3)

## 2015-07-16 LAB — HEPATITIS C ANTIBODY: HCV Ab: NEGATIVE

## 2015-07-22 ENCOUNTER — Encounter: Payer: Self-pay | Admitting: Internal Medicine

## 2015-07-22 ENCOUNTER — Ambulatory Visit (INDEPENDENT_AMBULATORY_CARE_PROVIDER_SITE_OTHER): Payer: PPO | Admitting: Internal Medicine

## 2015-07-22 VITALS — BP 116/70 | HR 88 | Temp 98.0°F | Ht 62.25 in | Wt 139.0 lb

## 2015-07-22 DIAGNOSIS — Z0000 Encounter for general adult medical examination without abnormal findings: Secondary | ICD-10-CM | POA: Diagnosis not present

## 2015-07-22 DIAGNOSIS — M8580 Other specified disorders of bone density and structure, unspecified site: Secondary | ICD-10-CM | POA: Diagnosis not present

## 2015-07-22 NOTE — Patient Instructions (Addendum)
Dexa is stable   Could add  Vit d 800 - 1000 iu per day .  Wellness/  Preventive exam in  1 year. Or as needed  If you wish can schedule  Wellness   Interview with Wynetta Fines  Our health coach.  Health Maintenance, Female Adopting a healthy lifestyle and getting preventive care can go a long way to promote health and wellness. Talk with your health care provider about what schedule of regular examinations is right for you. This is a good chance for you to check in with your provider about disease prevention and staying healthy. In between checkups, there are plenty of things you can do on your own. Experts have done a lot of research about which lifestyle changes and preventive measures are most likely to keep you healthy. Ask your health care provider for more information. WEIGHT AND DIET  Eat a healthy diet  Be sure to include plenty of vegetables, fruits, low-fat dairy products, and lean protein.  Do not eat a lot of foods high in solid fats, added sugars, or salt.  Get regular exercise. This is one of the most important things you can do for your health.  Most adults should exercise for at least 150 minutes each week. The exercise should increase your heart rate and make you sweat (moderate-intensity exercise).  Most adults should also do strengthening exercises at least twice a week. This is in addition to the moderate-intensity exercise.  Maintain a healthy weight  Body mass index (BMI) is a measurement that can be used to identify possible weight problems. It estimates body fat based on height and weight. Your health care provider can help determine your BMI and help you achieve or maintain a healthy weight.  For females 59 years of age and older:   A BMI below 18.5 is considered underweight.  A BMI of 18.5 to 24.9 is normal.  A BMI of 25 to 29.9 is considered overweight.  A BMI of 30 and above is considered obese.  Watch levels of cholesterol and blood lipids  You  should start having your blood tested for lipids and cholesterol at 70 years of age, then have this test every 5 years.  You may need to have your cholesterol levels checked more often if:  Your lipid or cholesterol levels are high.  You are older than 70 years of age.  You are at high risk for heart disease.  CANCER SCREENING   Lung Cancer  Lung cancer screening is recommended for adults 46-47 years old who are at high risk for lung cancer because of a history of smoking.  A yearly low-dose CT scan of the lungs is recommended for people who:  Currently smoke.  Have quit within the past 15 years.  Have at least a 30-pack-year history of smoking. A pack year is smoking an average of one pack of cigarettes a day for 1 year.  Yearly screening should continue until it has been 15 years since you quit.  Yearly screening should stop if you develop a health problem that would prevent you from having lung cancer treatment.  Breast Cancer  Practice breast self-awareness. This means understanding how your breasts normally appear and feel.  It also means doing regular breast self-exams. Let your health care provider know about any changes, no matter how small.  If you are in your 20s or 30s, you should have a clinical breast exam (CBE) by a health care provider every 1-3 years as part  of a regular health exam.  If you are 40 or older, have a CBE every year. Also consider having a breast X-ray (mammogram) every year.  If you have a family history of breast cancer, talk to your health care provider about genetic screening.  If you are at high risk for breast cancer, talk to your health care provider about having an MRI and a mammogram every year.  Breast cancer gene (BRCA) assessment is recommended for women who have family members with BRCA-related cancers. BRCA-related cancers include:  Breast.  Ovarian.  Tubal.  Peritoneal cancers.  Results of the assessment will determine  the need for genetic counseling and BRCA1 and BRCA2 testing. Cervical Cancer Your health care provider may recommend that you be screened regularly for cancer of the pelvic organs (ovaries, uterus, and vagina). This screening involves a pelvic examination, including checking for microscopic changes to the surface of your cervix (Pap test). You may be encouraged to have this screening done every 3 years, beginning at age 53.  For women ages 23-65, health care providers may recommend pelvic exams and Pap testing every 3 years, or they may recommend the Pap and pelvic exam, combined with testing for human papilloma virus (HPV), every 5 years. Some types of HPV increase your risk of cervical cancer. Testing for HPV may also be done on women of any age with unclear Pap test results.  Other health care providers may not recommend any screening for nonpregnant women who are considered low risk for pelvic cancer and who do not have symptoms. Ask your health care provider if a screening pelvic exam is right for you.  If you have had past treatment for cervical cancer or a condition that could lead to cancer, you need Pap tests and screening for cancer for at least 20 years after your treatment. If Pap tests have been discontinued, your risk factors (such as having a new sexual partner) need to be reassessed to determine if screening should resume. Some women have medical problems that increase the chance of getting cervical cancer. In these cases, your health care provider may recommend more frequent screening and Pap tests. Colorectal Cancer  This type of cancer can be detected and often prevented.  Routine colorectal cancer screening usually begins at 70 years of age and continues through 70 years of age.  Your health care provider may recommend screening at an earlier age if you have risk factors for colon cancer.  Your health care provider may also recommend using home test kits to check for hidden blood  in the stool.  A small camera at the end of a tube can be used to examine your colon directly (sigmoidoscopy or colonoscopy). This is done to check for the earliest forms of colorectal cancer.  Routine screening usually begins at age 11.  Direct examination of the colon should be repeated every 5-10 years through 69 years of age. However, you may need to be screened more often if early forms of precancerous polyps or small growths are found. Skin Cancer  Check your skin from head to toe regularly.  Tell your health care provider about any new moles or changes in moles, especially if there is a change in a mole's shape or color.  Also tell your health care provider if you have a mole that is larger than the size of a pencil eraser.  Always use sunscreen. Apply sunscreen liberally and repeatedly throughout the day.  Protect yourself by wearing long sleeves, pants, a  wide-brimmed hat, and sunglasses whenever you are outside. HEART DISEASE, DIABETES, AND HIGH BLOOD PRESSURE   High blood pressure causes heart disease and increases the risk of stroke. High blood pressure is more likely to develop in:  People who have blood pressure in the high end of the normal range (130-139/85-89 mm Hg).  People who are overweight or obese.  People who are African American.  If you are 31-70 years of age, have your blood pressure checked every 3-5 years. If you are 76 years of age or older, have your blood pressure checked every year. You should have your blood pressure measured twice--once when you are at a hospital or clinic, and once when you are not at a hospital or clinic. Record the average of the two measurements. To check your blood pressure when you are not at a hospital or clinic, you can use:  An automated blood pressure machine at a pharmacy.  A home blood pressure monitor.  If you are between 66 years and 75 years old, ask your health care provider if you should take aspirin to prevent  strokes.  Have regular diabetes screenings. This involves taking a blood sample to check your fasting blood sugar level.  If you are at a normal weight and have a low risk for diabetes, have this test once every three years after 70 years of age.  If you are overweight and have a high risk for diabetes, consider being tested at a younger age or more often. PREVENTING INFECTION  Hepatitis B  If you have a higher risk for hepatitis B, you should be screened for this virus. You are considered at high risk for hepatitis B if:  You were born in a country where hepatitis B is common. Ask your health care provider which countries are considered high risk.  Your parents were born in a high-risk country, and you have not been immunized against hepatitis B (hepatitis B vaccine).  You have HIV or AIDS.  You use needles to inject street drugs.  You live with someone who has hepatitis B.  You have had sex with someone who has hepatitis B.  You get hemodialysis treatment.  You take certain medicines for conditions, including cancer, organ transplantation, and autoimmune conditions. Hepatitis C  Blood testing is recommended for:  Everyone born from [birth year] through 1965.  Anyone with known risk factors for hepatitis C. Sexually transmitted infections (STIs)  You should be screened for sexually transmitted infections (STIs) including gonorrhea and chlamydia if:  You are sexually active and are younger than 70 years of age.  You are older than 70 years of age and your health care provider tells you that you are at risk for this type of infection.  Your sexual activity has changed since you were last screened and you are at an increased risk for chlamydia or gonorrhea. Ask your health care provider if you are at risk.  If you do not have HIV, but are at risk, it may be recommended that you take a prescription medicine daily to prevent HIV infection. This is called pre-exposure prophylaxis  (PrEP). You are considered at risk if:  You are sexually active and do not regularly use condoms or know the HIV status of your partner(s).  You take drugs by injection.  You are sexually active with a partner who has HIV. Talk with your health care provider about whether you are at high risk of being infected with HIV. If you choose to begin PrEP,  you should first be tested for HIV. You should then be tested every 3 months for as long as you are taking PrEP.  PREGNANCY   If you are premenopausal and you may become pregnant, ask your health care provider about preconception counseling.  If you may become pregnant, take 400 to 800 micrograms (mcg) of folic acid every day.  If you want to prevent pregnancy, talk to your health care provider about birth control (contraception). OSTEOPOROSIS AND MENOPAUSE   Osteoporosis is a disease in which the bones lose minerals and strength with aging. This can result in serious bone fractures. Your risk for osteoporosis can be identified using a bone density scan.  If you are 79 years of age or older, or if you are at risk for osteoporosis and fractures, ask your health care provider if you should be screened.  Ask your health care provider whether you should take a calcium or vitamin D supplement to lower your risk for osteoporosis.  Menopause may have certain physical symptoms and risks.  Hormone replacement therapy may reduce some of these symptoms and risks. Talk to your health care provider about whether hormone replacement therapy is right for you.  HOME CARE INSTRUCTIONS   Schedule regular health, dental, and eye exams.  Stay current with your immunizations.   Do not use any tobacco products including cigarettes, chewing tobacco, or electronic cigarettes.  If you are pregnant, do not drink alcohol.  If you are breastfeeding, limit how much and how often you drink alcohol.  Limit alcohol intake to no more than 1 drink per day for  nonpregnant women. One drink equals 12 ounces of beer, 5 ounces of wine, or 1 ounces of hard liquor.  Do not use street drugs.  Do not share needles.  Ask your health care provider for help if you need support or information about quitting drugs.  Tell your health care provider if you often feel depressed.  Tell your health care provider if you have ever been abused or do not feel safe at home.   This information is not intended to replace advice given to you by your health care provider. Make sure you discuss any questions you have with your health care provider.   Document Released: 08/24/2010 Document Revised: 03/01/2014 Document Reviewed: 01/10/2013 [employer]vier Interactive Patient Education [employer].

## 2015-07-22 NOTE — Progress Notes (Signed)
Pre visit review using our clinic review tool, if applicable. No additional management support is needed unless otherwise documented below in the visit note. 

## 2015-07-22 NOTE — Progress Notes (Signed)
Chief Complaint  Patient presents with  . Medicare Wellness    HPI: Cynthia Miller 70 y.o. comes in today for Preventive Medicare wellness visit .Since last visit. Doing well  Had lesion reexcision severe atypical  Left trunk has path for record  Allergy stable   Health Maintenance  Topic Date Due  . INFLUENZA VACCINE  09/23/2015  . MAMMOGRAM  01/06/2017  . TETANUS/TDAP  11/06/2017  . COLONOSCOPY  07/22/2020  . DEXA SCAN  Completed  . ZOSTAVAX  Completed  . Hepatitis C Screening  Completed  . PNA vac Low Risk Adult  Completed   Health Maintenance Review LIFESTYLE:  TAD neg tob 1-2 etoh  Walking weights no limitations  Can walk 4 miles  Sugar beverages:n Sleep:ok  MEDICARE DOCUMENT QUESTIONS  TO SCAN     Hearing: ok  Vision:  No limitations at present . Last eye check UTD  Safety:  Has smoke detector and wears seat belts.  No firearms. No excess sun exposure. Sees dentist regularly.  Falls: n  Advance directive :  Reviewed  Has one.  Memory: Felt to be good  , no concern from her or her family.  Depression: No anhedonia unusual crying or depressive symptoms  Nutrition: Eats well balanced diet; adequate calcium and vitamin D. No swallowing chewing problems.  Injury: no major injuries in the last six months.  Other healthcare providers:  Reviewed today .  Social:  Lives with spouse married. Pet    Preventive parameters: up-to-date  Reviewed   ADLS:   There are no problems or need for assistance  driving, feeding, obtaining food, dressing, toileting and bathing, managing money using phone. She is independent.    ROS:  GEN/ HEENT: No fever, significant weight changes sweats headaches vision problems hearing changes, CV/ PULM; No chest pain shortness of breath cough, syncope,edema  change in exercise tolerance. GI /GU: No adominal pain, vomiting, change in bowel habits. No blood in the stool. No significant GU symptoms. SKIN/HEME: ,no acute skin rashes  suspicious lesions or bleeding. No lymphadenopathy, nodules, masses.  NEURO/ PSYCH:  No neurologic signs such as weakness numbness. No depression anxiety. IMM/ Allergy: No unusual infections.  Allergy .   REST of 12 system review negative except as per HPI   Past Medical History  Diagnosis Date  . Low blood pressure   . Osteoarthritis     THUMB  . ALLERGIC RHINITIS   . FIBROCYSTIC BREAST DISEASE   . Osteopenia   . CIN I (cervical intraepithelial neoplasia I)   . Tinnitus of both ears     L>R, follows with ENT for same  . Hx of varicella     Family History  Problem Relation Age of Onset  . Diverticulosis Father   . Lung cancer Father     brain mets,did smoke  . Colon cancer Neg Hx   . Hyperlipidemia Mother     died age 54  . Thyroid disease Mother   . Breast cancer Paternal Grandmother   . Healthy Brother     Social History   Social History  . Marital Status: Married    Spouse Name: N/A  . Number of Children: 1  . Years of Education: 16   Occupational History  . volunteer    Social History Main Topics  . Smoking status: Never Smoker   . Smokeless tobacco: Never Used  . Alcohol Use: 8.4 oz/week    14 Glasses of wine per week  . Drug Use:  No  . Sexual Activity:    Partners: Male    Birth Control/ Protection: Surgical   Other Topics Concern  . None   Social History Narrative   [facility], [employer] in [place] to finish BA Art history. Married 1969 - 10 yr/divorce. Remarried '83 - 10 yrs/divorced. Remarried '97 -. 1 dtr - '74. 2 grandchildren. Work - volunteering. Marriage in good health.     Outpatient Encounter Prescriptions as of 07/22/2015  Medication Sig  . levocetirizine (XYZAL) 5 MG tablet Take 1 tablet (5 mg total) by mouth every evening.  . montelukast (SINGULAIR) 10 MG tablet Take 1 tablet (10 mg total) by mouth at bedtime.   No facility-administered encounter medications on file as of 07/22/2015.    EXAM:  BP 116/70 mmHg   Pulse 88  Temp(Src) 98 F (36.7 C) (Oral)  Ht 5' 2.25" (1.581 m)  Wt 139 lb (63.05 kg)  BMI 25.22 kg/m2  SpO2 97%  Body mass index is 25.22 kg/(m^2).  Physical Exam: Vital signs reviewed NOI:BBCW is a well-developed well-nourished alert cooperative   who appears stated age in no acute distress.  HEENT: normocephalic atraumatic , Eyes: PERRL EOM's full, conjunctiva clear, Nares: paten,t no deformity discharge or tenderness., Ears: no deformity EAC's clear TMs with normal landmarks. Mouth: clear OP, no lesions, edema.  Moist mucous membranes. Dentition in adequate repair. NECK: supple without masses, thyromegaly or bruits. CHEST/PULM:  Clear to auscultation and percussion breath sounds equal no wheeze , rales or rhonchi. No chest wall deformities or tenderness.Breast: normal by inspection . No dimpling, discharge, masses, tenderness or discharge . CV: PMI is nondisplaced, S1 S2 no gallops, murmurs, rubs. Peripheral pulses are full without delay.No JVD .  ABDOMEN: Bowel sounds normal nontender  No guard or rebound, no hepato splenomegal no CVA tenderness.   Extremtities:  No clubbing cyanosis or edema, no acute joint swelling or redness no focal atrophy NEURO:  Oriented x3, cranial nerves 3-12 appear to be intact, no obvious focal weakness,gait within normal limits no abnormal reflexes or asymmetrical SKIN: No acute rashes normal turgor, color, no bruising or petechiae. Left trunk well healed scar  PSYCH: Oriented, good eye contact, no obvious depression anxiety, cognition and judgment appear normal. LN: no cervical axillary inguinal adenopathy No noted deficits in memory, attention, and speech.   Lab Results  Component Value Date   WBC 4.3 07/15/2015   HGB 14.4 07/15/2015   HCT 42.4 07/15/2015   PLT 232.0 07/15/2015   GLUCOSE 99 07/15/2015   CHOL 160 07/15/2015   TRIG 44.0 07/15/2015   HDL 53.10 07/15/2015   LDLCALC 98 07/15/2015   ALT 18 07/15/2015   AST 15 07/15/2015   NA 140  07/15/2015   K 4.2 07/15/2015   CL 106 07/15/2015   CREATININE 0.67 07/15/2015   BUN 19 07/15/2015   CO2 27 07/15/2015   TSH 3.58 07/15/2015    ASSESSMENT AND PLAN:  Discussed the following assessment and plan:  Visit for preventive health examination  Osteopenia - vit d 28 dexa stable continue bone health  interventions  Patient Care Team: Burnis Medin, MD as PCP - General (Internal Medicine) Irene Shipper, MD (Gastroenterology) Jari Pigg, MD (Dermatology) Stefanie Libel, MD (Sports Medicine) Mosetta Anis, MD (Allergy) Leta Baptist, MD (Otolaryngology) Luberta Mutter, MD as Consulting Physician (Ophthalmology)  Patient Instructions  Dexa is stable   Could add  Vit d 800 - 1000 iu per day .  Wellness/  Preventive exam in  1 year. Or as needed  If you wish can schedule  Wellness   Interview with Wynetta Fines  Our health coach.  Health Maintenance, Female Adopting a healthy lifestyle and getting preventive care can go a long way to promote health and wellness. Talk with your health care provider about what schedule of regular examinations is right for you. This is a good chance for you to check in with your provider about disease prevention and staying healthy. In between checkups, there are plenty of things you can do on your own. Experts have done a lot of research about which lifestyle changes and preventive measures are most likely to keep you healthy. Ask your health care provider for more information. WEIGHT AND DIET  Eat a healthy diet  Be sure to include plenty of vegetables, fruits, low-fat dairy products, and lean protein.  Do not eat a lot of foods high in solid fats, added sugars, or salt.  Get regular exercise. This is one of the most important things you can do for your health.  Most adults should exercise for at least 150 minutes each week. The exercise should increase your heart rate and make you sweat (moderate-intensity exercise).  Most adults should also do  strengthening exercises at least twice a week. This is in addition to the moderate-intensity exercise.  Maintain a healthy weight  Body mass index (BMI) is a measurement that can be used to identify possible weight problems. It estimates body fat based on height and weight. Your health care provider can help determine your BMI and help you achieve or maintain a healthy weight.  For females 2 years of age and older:   A BMI below 18.5 is considered underweight.  A BMI of 18.5 to 24.9 is normal.  A BMI of 25 to 29.9 is considered overweight.  A BMI of 30 and above is considered obese.  Watch levels of cholesterol and blood lipids  You should start having your blood tested for lipids and cholesterol at 70 years of age, then have this test every 5 years.  You may need to have your cholesterol levels checked more often if:  Your lipid or cholesterol levels are high.  You are older than 70 years of age.  You are at high risk for heart disease.  CANCER SCREENING   Lung Cancer  Lung cancer screening is recommended for adults 68-30 years old who are at high risk for lung cancer because of a history of smoking.  A yearly low-dose CT scan of the lungs is recommended for people who:  Currently smoke.  Have quit within the past 15 years.  Have at least a 30-pack-year history of smoking. A pack year is smoking an average of one pack of cigarettes a day for 1 year.  Yearly screening should continue until it has been 15 years since you quit.  Yearly screening should stop if you develop a health problem that would prevent you from having lung cancer treatment.  Breast Cancer  Practice breast self-awareness. This means understanding how your breasts normally appear and feel.  It also means doing regular breast self-exams. Let your health care provider know about any changes, no matter how small.  If you are in your 20s or 30s, you should have a clinical breast exam (CBE) by a  health care provider every 1-3 years as part of a regular health exam.  If you are 5 or older, have a CBE every year. Also consider having a breast X-ray (  mammogram) every year.  If you have a family history of breast cancer, talk to your health care provider about genetic screening.  If you are at high risk for breast cancer, talk to your health care provider about having an MRI and a mammogram every year.  Breast cancer gene (BRCA) assessment is recommended for women who have family members with BRCA-related cancers. BRCA-related cancers include:  Breast.  Ovarian.  Tubal.  Peritoneal cancers.  Results of the assessment will determine the need for genetic counseling and BRCA1 and BRCA2 testing. Cervical Cancer Your health care provider may recommend that you be screened regularly for cancer of the pelvic organs (ovaries, uterus, and vagina). This screening involves a pelvic examination, including checking for microscopic changes to the surface of your cervix (Pap test). You may be encouraged to have this screening done every 3 years, beginning at age 75.  For women ages 79-65, health care providers may recommend pelvic exams and Pap testing every 3 years, or they may recommend the Pap and pelvic exam, combined with testing for human papilloma virus (HPV), every 5 years. Some types of HPV increase your risk of cervical cancer. Testing for HPV may also be done on women of any age with unclear Pap test results.  Other health care providers may not recommend any screening for nonpregnant women who are considered low risk for pelvic cancer and who do not have symptoms. Ask your health care provider if a screening pelvic exam is right for you.  If you have had past treatment for cervical cancer or a condition that could lead to cancer, you need Pap tests and screening for cancer for at least 20 years after your treatment. If Pap tests have been discontinued, your risk factors (such as having a  new sexual partner) need to be reassessed to determine if screening should resume. Some women have medical problems that increase the chance of getting cervical cancer. In these cases, your health care provider may recommend more frequent screening and Pap tests. Colorectal Cancer  This type of cancer can be detected and often prevented.  Routine colorectal cancer screening usually begins at 70 years of age and continues through 70 years of age.  Your health care provider may recommend screening at an earlier age if you have risk factors for colon cancer.  Your health care provider may also recommend using home test kits to check for hidden blood in the stool.  A small camera at the end of a tube can be used to examine your colon directly (sigmoidoscopy or colonoscopy). This is done to check for the earliest forms of colorectal cancer.  Routine screening usually begins at age 61.  Direct examination of the colon should be repeated every 5-10 years through 70 years of age. However, you may need to be screened more often if early forms of precancerous polyps or small growths are found. Skin Cancer  Check your skin from head to toe regularly.  Tell your health care provider about any new moles or changes in moles, especially if there is a change in a mole's shape or color.  Also tell your health care provider if you have a mole that is larger than the size of a pencil eraser.  Always use sunscreen. Apply sunscreen liberally and repeatedly throughout the day.  Protect yourself by wearing long sleeves, pants, a wide-brimmed hat, and sunglasses whenever you are outside. HEART DISEASE, DIABETES, AND HIGH BLOOD PRESSURE   High blood pressure causes heart disease and  increases the risk of stroke. High blood pressure is more likely to develop in:  People who have blood pressure in the high end of the normal range (130-139/85-89 mm Hg).  People who are overweight or obese.  People who are  African American.  If you are 80-30 years of age, have your blood pressure checked every 3-5 years. If you are 30 years of age or older, have your blood pressure checked every year. You should have your blood pressure measured twice--once when you are at a hospital or clinic, and once when you are not at a hospital or clinic. Record the average of the two measurements. To check your blood pressure when you are not at a hospital or clinic, you can use:  An automated blood pressure machine at a pharmacy.  A home blood pressure monitor.  If you are between 48 years and 57 years old, ask your health care provider if you should take aspirin to prevent strokes.  Have regular diabetes screenings. This involves taking a blood sample to check your fasting blood sugar level.  If you are at a normal weight and have a low risk for diabetes, have this test once every three years after 70 years of age.  If you are overweight and have a high risk for diabetes, consider being tested at a younger age or more often. PREVENTING INFECTION  Hepatitis B  If you have a higher risk for hepatitis B, you should be screened for this virus. You are considered at high risk for hepatitis B if:  You were born in a country where hepatitis B is common. Ask your health care provider which countries are considered high risk.  Your parents were born in a high-risk country, and you have not been immunized against hepatitis B (hepatitis B vaccine).  You have HIV or AIDS.  You use needles to inject street drugs.  You live with someone who has hepatitis B.  You have had sex with someone who has hepatitis B.  You get hemodialysis treatment.  You take certain medicines for conditions, including cancer, organ transplantation, and autoimmune conditions. Hepatitis C  Blood testing is recommended for:  Everyone born from [birth year] through 1965.  Anyone with known risk factors for hepatitis C. Sexually transmitted infections  (STIs)  You should be screened for sexually transmitted infections (STIs) including gonorrhea and chlamydia if:  You are sexually active and are younger than 70 years of age.  You are older than 70 years of age and your health care provider tells you that you are at risk for this type of infection.  Your sexual activity has changed since you were last screened and you are at an increased risk for chlamydia or gonorrhea. Ask your health care provider if you are at risk.  If you do not have HIV, but are at risk, it may be recommended that you take a prescription medicine daily to prevent HIV infection. This is called pre-exposure prophylaxis (PrEP). You are considered at risk if:  You are sexually active and do not regularly use condoms or know the HIV status of your partner(s).  You take drugs by injection.  You are sexually active with a partner who has HIV. Talk with your health care provider about whether you are at high risk of being infected with HIV. If you choose to begin PrEP, you should first be tested for HIV. You should then be tested every 3 months for as long as you are taking PrEP.  PREGNANCY   If you are premenopausal and you may become pregnant, ask your health care provider about preconception counseling.  If you may become pregnant, take 400 to 800 micrograms (mcg) of folic acid every day.  If you want to prevent pregnancy, talk to your health care provider about birth control (contraception). OSTEOPOROSIS AND MENOPAUSE   Osteoporosis is a disease in which the bones lose minerals and strength with aging. This can result in serious bone fractures. Your risk for osteoporosis can be identified using a bone density scan.  If you are 49 years of age or older, or if you are at risk for osteoporosis and fractures, ask your health care provider if you should be screened.  Ask your health care provider whether you should take a calcium or vitamin D supplement to lower your risk  for osteoporosis.  Menopause may have certain physical symptoms and risks.  Hormone replacement therapy may reduce some of these symptoms and risks. Talk to your health care provider about whether hormone replacement therapy is right for you.  HOME CARE INSTRUCTIONS   Schedule regular health, dental, and eye exams.  Stay current with your immunizations.   Do not use any tobacco products including cigarettes, chewing tobacco, or electronic cigarettes.  If you are pregnant, do not drink alcohol.  If you are breastfeeding, limit how much and how often you drink alcohol.  Limit alcohol intake to no more than 1 drink per day for nonpregnant women. One drink equals 12 ounces of beer, 5 ounces of wine, or 1 ounces of hard liquor.  Do not use street drugs.  Do not share needles.  Ask your health care provider for help if you need support or information about quitting drugs.  Tell your health care provider if you often feel depressed.  Tell your health care provider if you have ever been abused or do not feel safe at home.   This information is not intended to replace advice given to you by your health care provider. Make sure you discuss any questions you have with your health care provider.   Document Released: 08/24/2010 Document Revised: 03/01/2014 Document Reviewed: 01/10/2013 Elsevier Interactive Patient Education 2016 [place] K. Dresean Beckel M.D.

## 2015-11-24 ENCOUNTER — Ambulatory Visit (INDEPENDENT_AMBULATORY_CARE_PROVIDER_SITE_OTHER): Payer: PPO | Admitting: Family Medicine

## 2015-11-24 DIAGNOSIS — Z23 Encounter for immunization: Secondary | ICD-10-CM

## 2015-12-09 ENCOUNTER — Other Ambulatory Visit: Payer: Self-pay | Admitting: Internal Medicine

## 2015-12-09 DIAGNOSIS — Z1231 Encounter for screening mammogram for malignant neoplasm of breast: Secondary | ICD-10-CM

## 2016-01-08 ENCOUNTER — Ambulatory Visit
Admission: RE | Admit: 2016-01-08 | Discharge: 2016-01-08 | Disposition: A | Discharge status: Home / Self Care | Payer: PPO | Source: Ambulatory Visit | Attending: Internal Medicine | Admitting: Internal Medicine

## 2016-01-08 DIAGNOSIS — Z1231 Encounter for screening mammogram for malignant neoplasm of breast: Secondary | ICD-10-CM

## 2016-01-20 DIAGNOSIS — Z23 Encounter for immunization: Secondary | ICD-10-CM | POA: Diagnosis not present

## 2016-01-20 DIAGNOSIS — D2272 Melanocytic nevi of left lower limb, including hip: Secondary | ICD-10-CM | POA: Diagnosis not present

## 2016-01-20 DIAGNOSIS — Z411 Encounter for cosmetic surgery: Secondary | ICD-10-CM | POA: Diagnosis not present

## 2016-01-20 DIAGNOSIS — D2261 Melanocytic nevi of right upper limb, including shoulder: Secondary | ICD-10-CM | POA: Diagnosis not present

## 2016-01-20 DIAGNOSIS — D225 Melanocytic nevi of trunk: Secondary | ICD-10-CM | POA: Diagnosis not present

## 2016-01-20 DIAGNOSIS — Z86018 Personal history of other benign neoplasm: Secondary | ICD-10-CM | POA: Diagnosis not present

## 2016-04-07 DIAGNOSIS — J3089 Other allergic rhinitis: Secondary | ICD-10-CM | POA: Diagnosis not present

## 2016-04-27 DIAGNOSIS — H2513 Age-related nuclear cataract, bilateral: Secondary | ICD-10-CM | POA: Diagnosis not present

## 2016-04-27 DIAGNOSIS — H52203 Unspecified astigmatism, bilateral: Secondary | ICD-10-CM | POA: Diagnosis not present

## 2016-04-27 DIAGNOSIS — H524 Presbyopia: Secondary | ICD-10-CM | POA: Diagnosis not present

## 2016-07-08 DIAGNOSIS — N200 Calculus of kidney: Secondary | ICD-10-CM | POA: Diagnosis not present

## 2016-07-08 DIAGNOSIS — R109 Unspecified abdominal pain: Secondary | ICD-10-CM | POA: Diagnosis not present

## 2016-07-08 DIAGNOSIS — N132 Hydronephrosis with renal and ureteral calculous obstruction: Secondary | ICD-10-CM | POA: Insufficient documentation

## 2016-07-08 DIAGNOSIS — Z79899 Other long term (current) drug therapy: Secondary | ICD-10-CM | POA: Diagnosis not present

## 2016-07-08 HISTORY — DX: Calculus of kidney: N20.0

## 2016-07-09 ENCOUNTER — Emergency Department (HOSPITAL_COMMUNITY): Payer: PPO

## 2016-07-09 ENCOUNTER — Emergency Department (HOSPITAL_COMMUNITY)
Admission: EM | Admit: 2016-07-09 | Discharge: 2016-07-09 | Disposition: A | Discharge status: Home / Self Care | Payer: PPO | Attending: Emergency Medicine | Admitting: Emergency Medicine

## 2016-07-09 ENCOUNTER — Encounter (HOSPITAL_COMMUNITY): Payer: Self-pay | Admitting: Emergency Medicine

## 2016-07-09 DIAGNOSIS — R52 Pain, unspecified: Secondary | ICD-10-CM

## 2016-07-09 DIAGNOSIS — R109 Unspecified abdominal pain: Secondary | ICD-10-CM | POA: Diagnosis not present

## 2016-07-09 DIAGNOSIS — N200 Calculus of kidney: Secondary | ICD-10-CM

## 2016-07-09 LAB — CBC WITH DIFFERENTIAL/PLATELET
BASOS ABS: 0 10*3/uL (ref 0.0–0.1)
BASOS PCT: 0 %
EOS ABS: 0 10*3/uL (ref 0.0–0.7)
EOS PCT: 0 %
HCT: 41.5 % (ref 36.0–46.0)
Hemoglobin: 14.5 g/dL (ref 12.0–15.0)
LYMPHS PCT: 12 %
Lymphs Abs: 1 10*3/uL (ref 0.7–4.0)
MCH: 31.3 pg (ref 26.0–34.0)
MCHC: 34.9 g/dL (ref 30.0–36.0)
MCV: 89.4 fL (ref 78.0–100.0)
MONO ABS: 0.2 10*3/uL (ref 0.1–1.0)
Monocytes Relative: 3 %
Neutro Abs: 7.2 10*3/uL (ref 1.7–7.7)
Neutrophils Relative %: 85 %
PLATELETS: 235 10*3/uL (ref 150–400)
RBC: 4.64 MIL/uL (ref 3.87–5.11)
RDW: 13.4 % (ref 11.5–15.5)
WBC: 8.4 10*3/uL (ref 4.0–10.5)

## 2016-07-09 LAB — I-STAT CHEM 8, ED
BUN: 22 mg/dL — ABNORMAL HIGH (ref 6–20)
CHLORIDE: 102 mmol/L (ref 101–111)
Calcium, Ion: 1.15 mmol/L (ref 1.15–1.40)
Creatinine, Ser: 0.9 mg/dL (ref 0.44–1.00)
GLUCOSE: 168 mg/dL — AB (ref 65–99)
HCT: 43 % (ref 36.0–46.0)
HEMOGLOBIN: 14.6 g/dL (ref 12.0–15.0)
POTASSIUM: 3.8 mmol/L (ref 3.5–5.1)
SODIUM: 140 mmol/L (ref 135–145)
TCO2: 25 mmol/L (ref 0–100)

## 2016-07-09 LAB — URINALYSIS, ROUTINE W REFLEX MICROSCOPIC
BILIRUBIN URINE: NEGATIVE
GLUCOSE, UA: NEGATIVE mg/dL
KETONES UR: 80 mg/dL — AB
NITRITE: NEGATIVE
PROTEIN: 30 mg/dL — AB
Specific Gravity, Urine: 1.027 (ref 1.005–1.030)
pH: 5 (ref 5.0–8.0)

## 2016-07-09 MED ORDER — KETOROLAC TROMETHAMINE 30 MG/ML IJ SOLN
30.0000 mg | Freq: Once | INTRAMUSCULAR | Status: AC
  Administered 2016-07-09: 30 mg via INTRAVENOUS
  Filled 2016-07-09: qty 1

## 2016-07-09 MED ORDER — SODIUM CHLORIDE 0.9 % IV BOLUS (SEPSIS)
500.0000 mL | Freq: Once | INTRAVENOUS | Status: AC
  Administered 2016-07-09: 500 mL via INTRAVENOUS

## 2016-07-09 MED ORDER — OXYCODONE-ACETAMINOPHEN 5-325 MG PO TABS: 1.0000 | ORAL_TABLET | Freq: Four times a day (QID) | ORAL | 0 refills | Status: DC | PRN

## 2016-07-09 MED ORDER — ONDANSETRON 8 MG PO TBDP: ORAL_TABLET | ORAL | 0 refills | Status: DC

## 2016-07-09 MED ORDER — FENTANYL CITRATE (PF) 100 MCG/2ML IJ SOLN
50.0000 ug | Freq: Once | INTRAMUSCULAR | Status: AC
  Administered 2016-07-09: 50 ug via INTRAVENOUS
  Filled 2016-07-09: qty 2

## 2016-07-09 MED ORDER — ONDANSETRON HCL 4 MG/2ML IJ SOLN
4.0000 mg | Freq: Once | INTRAMUSCULAR | Status: AC
  Administered 2016-07-09: 4 mg via INTRAVENOUS
  Filled 2016-07-09: qty 2

## 2016-07-09 MED ORDER — MAGNESIUM SULFATE 2 GM/50ML IV SOLN
2.0000 g | Freq: Once | INTRAVENOUS | Status: AC
  Administered 2016-07-09: 2 g via INTRAVENOUS
  Filled 2016-07-09: qty 50

## 2016-07-09 MED ORDER — DICLOFENAC SODIUM ER 100 MG PO TB24: 100.0000 mg | ORAL_TABLET | Freq: Every day | ORAL | 0 refills | Status: DC

## 2016-07-09 MED ORDER — TAMSULOSIN HCL 0.4 MG PO CAPS: 0.4000 mg | ORAL_CAPSULE | Freq: Every day | ORAL | 0 refills | Status: DC

## 2016-07-09 MED ORDER — TAMSULOSIN HCL 0.4 MG PO CAPS
0.4000 mg | ORAL_CAPSULE | Freq: Every day | ORAL | Status: DC
  Administered 2016-07-09: 0.4 mg via ORAL
  Filled 2016-07-09: qty 1

## 2016-07-09 NOTE — ED Notes (Signed)
Waiting for iv fluids to complete.

## 2016-07-09 NOTE — ED Triage Notes (Signed)
Pt states about 9pm her right side started to hurt and she started to have vomiting  Pt states the pain is deep aching and burning and radiates around to the front

## 2016-07-09 NOTE — ED Provider Notes (Signed)
[facility] DEPT Provider Note   CSN: [id number] Arrival date & time: 07/08/16  2357  By signing my name below, I, Margit Banda, attest that this documentation has been prepared under the direction and in the presence of [place], Arna Luis, MD. Electronically Signed: Margit Banda, ED Scribe. 07/09/16. 2:30 AM.   History   Chief Complaint Chief Complaint  Patient presents with  . Flank Pain    HPI Cynthia Miller is a 71 y.o. female who presents to the Emergency Department complaining of deep, aching, and burning, right flank pain that started ~ 9 pm on 07/08/16. Pain radiates to the front. Associated sx include nausea. Pt denies fever, vomiting and any other sx at this time.  PCP: Burnis Medin, MD  The history is provided by the patient. No language interpreter was used.  Flank Pain  This is a new problem. The current episode started 6 to 12 hours ago. The problem occurs constantly. The problem has not changed since onset.Associated symptoms include abdominal pain. Nothing aggravates the symptoms. Nothing relieves the symptoms. She has tried nothing for the symptoms. The treatment provided no relief.    Past Medical History:  Diagnosis Date  . ALLERGIC RHINITIS   . CIN I (cervical intraepithelial neoplasia I)   . FIBROCYSTIC BREAST DISEASE   . Hx of varicella   . Low blood pressure   . Osteoarthritis    THUMB  . Osteopenia   . Tinnitus of both ears    L>R, follows with ENT for same    Patient Active Problem List   Diagnosis Date Noted  . Left lateral knee pain 12/20/2013  . CIN I (cervical intraepithelial neoplasia I)   . Osteoarthritis   . CIN (conjunctival intraepithelial neoplasia)   . Osteopenia   . Leg length discrepancy 11/17/2010  . 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 ALLERGIC RHINITIS 11/07/2007  . FIBROCYSTIC BREAST DISEASE 11/07/2007    Past Surgical History:  Procedure Laterality Date  . BREAST CYST ASPIRATION    . CERVICAL CONE BIOPSY      . COLONOSCOPY    . COLPOSCOPY    . LAPAROSCOPIC TUBAL LIGATION    . RIGHT FOOT     bednarz  . TUBAL LIGATION      OB History    Gravida Para Term Preterm AB Living   1 1 1     1    SAB TAB Ectopic Multiple Live Births                   Home Medications    Prior to Admission medications   Medication Sig Start Date End Date Taking? Authorizing Provider  levocetirizine (XYZAL) 5 MG tablet Take 1 tablet (5 mg total) by mouth every evening. 04/17/14  Yes Rowe Clack, MD  montelukast (SINGULAIR) 10 MG tablet Take 1 tablet (10 mg total) by mouth at bedtime. 04/17/14  Yes Rowe Clack, MD    Family History Family History  Problem Relation Age of Onset  . Diverticulosis Father   . Lung cancer Father        brain mets,did smoke  . Hyperlipidemia Mother        died age 25  . Thyroid disease Mother   . Breast cancer Paternal Grandmother   . Healthy Brother   . Colon cancer Neg Hx     Social History Social History  Substance Use Topics  . Smoking status: Never Smoker  . Smokeless tobacco: Never Used  .  Alcohol use 8.4 oz/week    14 Glasses of wine per week     Allergies   Penicillins   Review of Systems Review of Systems  Constitutional: Negative for fever.  Gastrointestinal: Positive for abdominal pain and nausea. Negative for vomiting.  Genitourinary: Positive for flank pain and hematuria.  All other systems reviewed and are negative.   Physical Exam Updated Vital Signs BP 121/61 (BP Location: Right Arm)   Pulse 62   Temp 97.6 F (36.4 C) (Oral)   Resp 18   SpO2 100%   Physical Exam  Constitutional: She appears well-developed and well-nourished. No distress.  HENT:  Head: Normocephalic and atraumatic.  Mouth/Throat: Oropharynx is clear and moist. No oropharyngeal exudate.  Eyes: Conjunctivae and EOM are normal. Pupils are equal, round, and reactive to light. Right eye exhibits no discharge. Left eye exhibits no discharge. No scleral  icterus.  Neck: Normal range of motion. Neck supple. No JVD present. No tracheal deviation present.  Trachea is midline. No stridor or carotid bruits.   Cardiovascular: Normal rate, regular rhythm, normal heart sounds and intact distal pulses.  Exam reveals no friction rub.   No murmur heard. Pulmonary/Chest: Effort normal and breath sounds normal. No stridor. No respiratory distress. She has no wheezes. She has no rales.  Lungs CTA bilaterally.  Abdominal: Soft. Bowel sounds are normal. She exhibits no distension. There is no tenderness. There is no rebound and no guarding.  Musculoskeletal: Normal range of motion. She exhibits no edema or tenderness.  All compartments are soft. No palpable cords.   Lymphadenopathy:    She has no cervical adenopathy.  Neurological: She is alert. She has normal reflexes. She displays normal reflexes. She exhibits normal muscle tone.  Skin: Skin is warm and dry. Capillary refill takes less than 2 seconds.  Psychiatric: She has a normal mood and affect. Her behavior is normal.  Nursing note and vitals reviewed.    ED Treatments / Results   Vitals:   07/09/16 0019 07/09/16 0235  BP: 121/61 (!) 113/46  Pulse: 62 68  Resp: 18 16  Temp: 97.6 F (36.4 C)     DIAGNOSTIC STUDIES: Oxygen Saturation is 100% on RA, normal by my interpretation.   COORDINATION OF CARE: 2:30 AM-Discussed next steps with pt. Pt verbalized understanding and is agreeable with the plan.   Labs (all labs ordered are listed, but only abnormal results are displayed) Results for orders placed or performed during the hospital encounter of 07/09/16  Urinalysis, Routine w reflex microscopic- may I&O cath if menses  Result Value Ref Range   Color, Urine YELLOW YELLOW   APPearance HAZY (A) CLEAR   Specific Gravity, Urine 1.027 1.005 - 1.030   pH 5.0 5.0 - 8.0   Glucose, UA NEGATIVE NEGATIVE mg/dL   Hgb urine dipstick LARGE (A) NEGATIVE   Bilirubin Urine NEGATIVE NEGATIVE    Ketones, ur 80 (A) NEGATIVE mg/dL   Protein, ur 30 (A) NEGATIVE mg/dL   Nitrite NEGATIVE NEGATIVE   Leukocytes, UA SMALL (A) NEGATIVE   RBC / HPF TOO NUMEROUS TO COUNT 0 - 5 RBC/hpf   WBC, UA 6-30 0 - 5 WBC/hpf   Bacteria, UA RARE (A) NONE SEEN   Squamous Epithelial / LPF 0-5 (A) NONE SEEN   Mucous PRESENT    Ca Oxalate Crys, UA PRESENT   CBC with Differential/Platelet  Result Value Ref Range   WBC 8.4 4.0 - 10.5 K/uL   RBC 4.64 3.87 - 5.11 MIL/uL  Hemoglobin 14.5 12.0 - 15.0 g/dL   HCT 41.5 36.0 - 46.0 %   MCV 89.4 78.0 - 100.0 fL   MCH 31.3 26.0 - 34.0 pg   MCHC 34.9 30.0 - 36.0 g/dL   RDW 13.4 11.5 - 15.5 %   Platelets 235 150 - 400 K/uL   Neutrophils Relative % 85 %   Neutro Abs 7.2 1.7 - 7.7 K/uL   Lymphocytes Relative 12 %   Lymphs Abs 1.0 0.7 - 4.0 K/uL   Monocytes Relative 3 %   Monocytes Absolute 0.2 0.1 - 1.0 K/uL   Eosinophils Relative 0 %   Eosinophils Absolute 0.0 0.0 - 0.7 K/uL   Basophils Relative 0 %   Basophils Absolute 0.0 0.0 - 0.1 K/uL  I-Stat Chem 8, ED  Result Value Ref Range   Sodium 140 135 - 145 mmol/L   Potassium 3.8 3.5 - 5.1 mmol/L   Chloride 102 101 - 111 mmol/L   BUN 22 (H) 6 - 20 mg/dL   Creatinine, Ser 0.90 0.44 - 1.00 mg/dL   Glucose, Bld 168 (H) 65 - 99 mg/dL   Calcium, Ion 1.15 1.15 - 1.40 mmol/L   TCO2 25 0 - 100 mmol/L   Hemoglobin 14.6 12.0 - 15.0 g/dL   HCT 43.0 36.0 - 46.0 %   Ct Renal Stone Study  Result Date: 07/09/2016 CLINICAL DATA:  Right flank pain since last night. EXAM: CT ABDOMEN AND PELVIS WITHOUT CONTRAST TECHNIQUE: Multidetector CT imaging of the abdomen and pelvis was performed following the standard protocol without IV contrast. COMPARISON:  None. FINDINGS: Lower chest: The lung bases are clear.  Small hiatal hernia. Hepatobiliary: No focal hepatic lesion allowing for lack contrast. Gallbladder physiologically distended, no calcified stone. No biliary dilatation. Pancreas: No ductal dilatation or inflammation.  Spleen: Normal in size without focal abnormality. Adrenals/Urinary Tract: Normal adrenal glands. Obstructing 5 x 5 mm stone in the right mid proximal ureter (at the level of L3-L4) with moderate proximal hydronephrosis. There is mild perinephric edema. No additional nonobstructing stones in either kidney. No left hydronephrosis. Urinary bladder is minimally distended, no bladder wall thickening or stone. Stomach/Bowel: Stomach is within normal limits. There is a duodenum diverticulum without inflammation. Appendix appears normal. No evidence of bowel wall thickening, distention, or inflammatory changes. Vascular/Lymphatic: Trace aortic atherosclerosis without aneurysm. No abdominal or pelvic adenopathy. Reproductive: Uterus and bilateral adnexa are unremarkable. Other: No free air, free fluid, or intra-abdominal fluid collection. Musculoskeletal: Scattered degenerative disc disease and facet arthropathy in the spine. There are no acute or suspicious osseous abnormalities. IMPRESSION: Obstructing 5 x 5 mm stone in the right mid proximal ureter with moderate hydronephrosis. Electronically Signed   By: Jeb Levering M.D.   On: 07/09/2016 02:13    Radiology Ct Renal Stone Study  Result Date: 07/09/2016 CLINICAL DATA:  Right flank pain since last night. EXAM: CT ABDOMEN AND PELVIS WITHOUT CONTRAST TECHNIQUE: Multidetector CT imaging of the abdomen and pelvis was performed following the standard protocol without IV contrast. COMPARISON:  None. FINDINGS: Lower chest: The lung bases are clear.  Small hiatal hernia. Hepatobiliary: No focal hepatic lesion allowing for lack contrast. Gallbladder physiologically distended, no calcified stone. No biliary dilatation. Pancreas: No ductal dilatation or inflammation. Spleen: Normal in size without focal abnormality. Adrenals/Urinary Tract: Normal adrenal glands. Obstructing 5 x 5 mm stone in the right mid proximal ureter (at the level of L3-L4) with moderate proximal  hydronephrosis. There is mild perinephric edema. No additional nonobstructing stones in  either kidney. No left hydronephrosis. Urinary bladder is minimally distended, no bladder wall thickening or stone. Stomach/Bowel: Stomach is within normal limits. There is a duodenum diverticulum without inflammation. Appendix appears normal. No evidence of bowel wall thickening, distention, or inflammatory changes. Vascular/Lymphatic: Trace aortic atherosclerosis without aneurysm. No abdominal or pelvic adenopathy. Reproductive: Uterus and bilateral adnexa are unremarkable. Other: No free air, free fluid, or intra-abdominal fluid collection. Musculoskeletal: Scattered degenerative disc disease and facet arthropathy in the spine. There are no acute or suspicious osseous abnormalities. IMPRESSION: Obstructing 5 x 5 mm stone in the right mid proximal ureter with moderate hydronephrosis. Electronically Signed   By: Jeb Levering M.D.   On: 07/09/2016 02:13    Procedures Procedures (including critical care time)  Medications Ordered in ED Medications  tamsulosin (FLOMAX) capsule 0.4 mg (0.4 mg Oral Given 07/09/16 0224)  magnesium sulfate IVPB 2 g 50 mL (2 g Intravenous New Bag/Given 07/09/16 0224)  fentaNYL (SUBLIMAZE) injection 50 mcg (50 mcg Intravenous Given 07/09/16 0119)  ketorolac (TORADOL) 30 MG/ML injection 30 mg (30 mg Intravenous Given 07/09/16 0224)  sodium chloride 0.9 % bolus 500 mL (500 mLs Intravenous New Bag/Given 07/09/16 0224)  ondansetron (ZOFRAN) injection 4 mg (4 mg Intravenous Given 07/09/16 0224)      Final Clinical Impressions(s) / ED Diagnoses  Kidney stones: The patient is nontoxic-appearing on exam and vital signs are within normal limits.  Return for persistent pain, vomiting, weakness, or any concerns.  Follow up with neurology. Strain all urine and follow up with urology.    I have reviewed the triage vital signs and the nursing notes. Pertinent labs &imaging results that were  available during my care of the patient were reviewed by me and considered in my medical decision making (see chart for details). The patient is nontoxic-appearing on exam and vital signs are within normal limits. Return for fevers, chest pain with exertion, weakness, numbness, neck pain or stiffness, inability to make or understand speech or any concerns.   After history, exam, and medical workup I feel the patient has been appropriately medically screened and is safe for discharge home. Pertinent diagnoses were discussed with the patient. Patient was given return precautions.   I personally performed the services described in this documentation, which was scribed in my presence. The recorded information has been reviewed and is accurate.        Tehya Leath, MD 07/09/16 2637

## 2016-07-12 ENCOUNTER — Telehealth: Payer: Self-pay | Admitting: Internal Medicine

## 2016-07-12 ENCOUNTER — Telehealth: Payer: Self-pay

## 2016-07-12 NOTE — Telephone Encounter (Signed)
Attempted PA for Ondansetron. PA denied & form faxed back to pharmacy letting them know patient will have to pay out of pocket.

## 2016-07-12 NOTE — Progress Notes (Signed)
Chief Complaint  Patient presents with  . Hospitalization Follow-up    HPI: Cynthia Miller 71 y.o. come in for sx  And seen recnetly for  Renal stone with hydronephrosis   Post ed and ? About  Sx  Given   meds as listed She is not taking the Percocet is taking the Voltaren fairly regularly and was taking the Flomax until yesterday. Feels sort of lightheaded dizziness but no syncope or neurologic symptoms. Her pain is better has a twinge in the right lower abdomen has appointment with urology today at noon. Has not had this problem before. Is having loose stools but not full-blown diarrhea. ROS: See pertinent positives and negatives per HPI. No cp sob   Past Medical History:  Diagnosis Date  . ALLERGIC RHINITIS   . CIN I (cervical intraepithelial neoplasia I)   . FIBROCYSTIC BREAST DISEASE   . Hx of varicella   . Low blood pressure   . Osteoarthritis    THUMB  . Osteopenia   . Tinnitus of both ears    L>R, follows with ENT for same    Family History  Problem Relation Age of Onset  . Diverticulosis Father   . Lung cancer Father        brain mets,did smoke  . Hyperlipidemia Mother        died age 58  . Thyroid disease Mother   . Breast cancer Paternal Grandmother   . Healthy Brother   . Colon cancer Neg Hx     Social History   Social History  . Marital status: Married    Spouse name: N/A  . Number of children: 1  . Years of education: 39   Occupational History  . volunteer Retired   Social History Main Topics  . Smoking status: Never Smoker  . Smokeless tobacco: Never Used  . Alcohol use 8.4 oz/week    14 Glasses of wine per week  . Drug use: No  . Sexual activity: Yes    Partners: Male    Birth control/ protection: Surgical   Other Topics Concern  . 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 Married 1969 - 10 yr/divorce. Remarried '83 - 10 yrs/divorced. Remarried '97 -. 1 dtr - '74. 2  grandchildren. Work - volunteering. Marriage in good health.     Outpatient Medications Prior to Visit  Medication Sig Dispense Refill  . Diclofenac Sodium CR (VOLTAREN-XR) 100 MG 24 hr tablet Take 1 tablet (100 mg total) by mouth daily. 10 tablet 0  . levocetirizine (XYZAL) 5 MG tablet Take 1 tablet (5 mg total) by mouth every evening.    . montelukast (SINGULAIR) 10 MG tablet Take 1 tablet (10 mg total) by mouth at bedtime.    . tamsulosin (FLOMAX) 0.4 MG CAPS capsule Take 1 capsule (0.4 mg total) by mouth daily. 30 capsule 0  . ondansetron (ZOFRAN ODT) 8 MG disintegrating tablet 8mg  ODT q8 hours prn nausea (Patient not taking: Reported on 07/13/2016) 12 tablet 0  . oxyCODONE-acetaminophen (PERCOCET) 5-325 MG tablet Take 1 tablet by mouth every 6 (six) hours as needed. (Patient not taking: Reported on 07/13/2016) 10 tablet 0   No facility-administered medications prior to visit.      EXAM:  BP 122/80 (BP Location: Right Arm, Patient Position: Sitting, Cuff Size: Normal)   Pulse 71   Temp 97.9 F (36.6 C) (Oral)   Ht 5' 2.25" (1.581  m)   Wt 137 lb 3.2 oz (62.2 kg)   BMI 24.89 kg/m   Body mass index is 24.89 kg/m.  GENERAL: vitals reviewed and listed above, alert, oriented, appears well hydrated and in no acute distress HEENT: atraumatic, conjunctiva  clear, no obvious abnormalities on inspection of external nose and ears NECK: no obvious masses on inspection palpation  LUNGS: clear to auscultation bilaterally, no wheezes, rales or rhonchi, good air movement bp standing was 176 systolic  CV: HRRR, no clubbing cyanosis or  peripheral edema nl cap refill  MS: moves all extremities without noticeable focal  abnormality PSYCH: pleasant and cooperative, no obvious depression or anxiety Lab Results  Component Value Date   WBC 8.4 07/09/2016   HGB 14.6 07/09/2016   HCT 43.0 07/09/2016   PLT 235 07/09/2016   GLUCOSE 168 (H) 07/09/2016   CHOL 160 07/15/2015   TRIG 44.0 07/15/2015    HDL 53.10 07/15/2015   LDLCALC 98 07/15/2015   ALT 18 07/15/2015   AST 15 07/15/2015   NA 140 07/09/2016   K 3.8 07/09/2016   CL 102 07/09/2016   CREATININE 0.90 07/09/2016   BUN 22 (H) 07/09/2016   CO2 27 07/15/2015   TSH 3.58 07/15/2015   HGBA1C 5.5 07/13/2016   BP Readings from Last 3 Encounters:  07/13/16 122/80  07/09/16 (!) 113/46  07/22/15 116/70   reviewed ed visit and labs  ASSESSMENT AND PLAN:  Discussed the following assessment and plan:  Kidney stones  Lightheadedness - poss side effect of med    disc  taking little now  vs stab le   Hyperglycemia - Plan: POCT glycosylated hemoglobin (Hb A1C) Noon today .  Has uro appt .    Has appt.  Never took   Percocet  Taking voltaren and stopped flomas yesterday?  Not sure  Has  Loose stools  Poss from  voltaren  . Discussed risk-benefit of each medicine and follow-up with urology. Her hyperglycemia at the visit is not diabetes  With nl a1c most likely related to the medical acuity of the situation.   Total visit 63mins > 50% spent counseling and coordinating care as indicated in above note and in instructions to patient .  reviewed with patient  Se of med  Options for  Plan and   Expectant management. Keep her yearly visit planned for next week .    -Patient advised to return or notify health care team  if  new concerns arise.  Patient Instructions   Only use the voltaren as needed  Because could cause  Light headedness  And GI sx .  If needed.  fo rpain   Ask uro about flomax .   Can take  percoct if needed for sever pain   Lab Results  Component Value Date   WBC 8.4 07/09/2016   HGB 14.6 07/09/2016   HCT 43.0 07/09/2016   PLT 235 07/09/2016   GLUCOSE 168 (H) 07/09/2016   CHOL 160 07/15/2015   TRIG 44.0 07/15/2015   HDL 53.10 07/15/2015   LDLCALC 98 07/15/2015   ALT 18 07/15/2015   AST 15 07/15/2015   NA 140 07/09/2016   K 3.8 07/09/2016   CL 102 07/09/2016   CREATININE 0.90 07/09/2016   BUN 22 (H)  07/09/2016   CO2 27 07/15/2015   TSH 3.58 07/15/2015         Cynthia Miller K. Aaric Dolph M.D.

## 2016-07-12 NOTE — Telephone Encounter (Signed)
[facility] - [facility]  Patient Name: Cynthia Miller  DOB: [birth year]    Initial Comment Caller was in ED last week, dx kidney stone, waiting for specialist appt. Meds causing lightheadedness.    Nurse Assessment  Nurse: Cox, RN, [place] Date/Time (Eastern Time): 07/12/2016 3:15:01 PM  Confirm and document reason for call. If symptomatic, describe symptoms. ---Caller states she is lightheaded. In ER last week for kidney stone. Prescribed Voltarin- anti inflamatory and flomax- to dilate urethra. Feels 'drugged.' Lightheaded and sleepy. Said both drugs have these side effects. Until she can see urologist, does she need to continue meds  Does the patient have any new or worsening symptoms? ---Yes  Will a triage be completed? ---Yes  Related visit to physician within the last 2 weeks? ---Yes  Does the PT have any chronic conditions? (i.e. diabetes, asthma, etc.) ---No  Is this a behavioral health or substance abuse call? ---No     Guidelines    Guideline Title Affirmed Question Affirmed Notes  Dizziness - Lightheadedness Taking a medicine that could cause dizziness (e.g., blood pressure medications, diuretics)    Final Disposition User   See Physician within 24 Hours Cox, RN, [place]    Comments  ER Thursday night  has 5 mm stone from CT scan, was told she should be able to pass it  Can come anytime tomorrow.  MD appt made with Dr. Regis Bill for 07/13/16 at 8:30am with arrival time at 8:15am at the [facility] location.   Referrals  REFERRED TO PCP OFFICE   Disagree/Comply: Comply

## 2016-07-13 ENCOUNTER — Encounter: Payer: Self-pay | Admitting: Internal Medicine

## 2016-07-13 ENCOUNTER — Ambulatory Visit (INDEPENDENT_AMBULATORY_CARE_PROVIDER_SITE_OTHER): Payer: PPO | Admitting: Internal Medicine

## 2016-07-13 VITALS — BP 122/80 | HR 71 | Temp 97.9°F | Ht 62.25 in | Wt 137.2 lb

## 2016-07-13 DIAGNOSIS — N200 Calculus of kidney: Secondary | ICD-10-CM

## 2016-07-13 DIAGNOSIS — N201 Calculus of ureter: Secondary | ICD-10-CM | POA: Diagnosis not present

## 2016-07-13 DIAGNOSIS — R42 Dizziness and giddiness: Secondary | ICD-10-CM

## 2016-07-13 DIAGNOSIS — R739 Hyperglycemia, unspecified: Secondary | ICD-10-CM

## 2016-07-13 LAB — POCT GLYCOSYLATED HEMOGLOBIN (HGB A1C): HEMOGLOBIN A1C: 5.5

## 2016-07-13 NOTE — Patient Instructions (Signed)
Only use the voltaren as needed  Because could cause  Light headedness  And GI sx .  If needed.  fo rpain   Ask uro about flomax .   Can take  percoct if needed for sever pain   Lab Results  Component Value Date   WBC 8.4 07/09/2016   HGB 14.6 07/09/2016   HCT 43.0 07/09/2016   PLT 235 07/09/2016   GLUCOSE 168 (H) 07/09/2016   CHOL 160 07/15/2015   TRIG 44.0 07/15/2015   HDL 53.10 07/15/2015   LDLCALC 98 07/15/2015   ALT 18 07/15/2015   AST 15 07/15/2015   NA 140 07/09/2016   K 3.8 07/09/2016   CL 102 07/09/2016   CREATININE 0.90 07/09/2016   BUN 22 (H) 07/09/2016   CO2 27 07/15/2015   TSH 3.58 07/15/2015

## 2016-07-14 ENCOUNTER — Other Ambulatory Visit: Payer: PPO

## 2016-07-20 NOTE — Progress Notes (Signed)
Chief Complaint  Patient presents with  . Annual Exam    HPI: Cynthia Miller 71 y.o. comes in today for Preventive visit .Since last visit. An another episode of right lower abdomen radiating pain to the perineal area thinks she may have passed this done but occurred during a bowel movement since she was not able to catch any urine strained. She has no hematuria feels better generally Does not have a follow-up with the urologist until June 11. She continues on her Flomax but feels it makes her feel badly. Like to get off sooner if possible. Is going to go back on her allergy medicines as her congestion is getting worse again. She is not taking pain medicine at this time.  Health Maintenance  Topic Date Due  . INFLUENZA VACCINE  09/22/2016  . TETANUS/TDAP  11/06/2017  . MAMMOGRAM  01/07/2018  . COLONOSCOPY  07/22/2020  . DEXA SCAN  Completed  . Hepatitis C Screening  Completed  . PNA vac Low Risk Adult  Completed   Health Maintenance Review LIFESTYLE:  TAD wine 1 /n when well , no rd tobacc  Sugar beverages: Sleep: 7 hours  Not that great arises at 6 am .  Exercise 4 miles soft yoga       Hearing: ok  Vision:  No limitations at present . Last eye check UTD  Safety:  Has smoke detector and wears seat belts.  No firearms. No excess sun exposure. Sees dentist regularly.  Falls:  Yes trip broken grocery bag freaky  Chin injury not a balance  Problem   Advance directive :  Reviewed  .  Memory: Felt to be good  , no concern from her or her family.  Depression: No anhedonia unusual crying or depressive symptoms  Nutrition: Eats well balanced diet; adequate calcium and vitamin D. No swallowing chewing problems.  Injury: no major injuries in the last six months.  Other healthcare providers:  Reviewed today .  Social:  Lives with spouse married. Pet cat   Preventive parameters: up-to-date  Reviewed   ADLS:   There are no problems or need for assistance  driving,  feeding, obtaining food, dressing, toileting and bathing, managing money using phone. She is independent.    ROS:  See hpi  GEN/ HEENT: No fever, significant weight changes sweats headaches vision problems hearing changes, CV/ PULM; No chest pain shortness of breath cough, syncope,edema  change in exercise tolerance. GI /GU: No adominal pain, vomiting, change in bowel habits. No blood in the stool. No significant GU symptoms. SKIN/HEME: ,no acute skin rashes suspicious lesions or bleeding. No lymphadenopathy, nodules, masses.  NEURO/ PSYCH:  No neurologic signs such as weakness numbness. No depression anxiety. IMM/ Allergy: No unusual infections.  Allergy .   REST of 12 system review negative except as per HPI   Past Medical History:  Diagnosis Date  . ALLERGIC RHINITIS   . CIN I (cervical intraepithelial neoplasia I)   . FIBROCYSTIC BREAST DISEASE   . Hx of varicella   . Kidney stones 07/08/2016  . Low blood pressure   . Osteoarthritis    THUMB  . Osteopenia   . Tinnitus of both ears    L>R, follows with ENT for same    Family History  Problem Relation Age of Onset  . Diverticulosis Father   . Lung cancer Father        brain mets,did smoke  . Hyperlipidemia Mother        died  age 58  . Thyroid disease Mother   . Breast cancer Paternal Grandmother   . Healthy Brother   . Colon cancer Neg Hx     Social History   Social History  . Marital status: Married    Spouse name: N/A  . Number of children: 1  . Years of education: 14   Occupational History  . volunteer Retired   Social History Main Topics  . Smoking status: Never Smoker  . Smokeless tobacco: Never Used  . Alcohol use 8.4 oz/week    14 Glasses of wine per week  . Drug use: No  . Sexual activity: Yes    Partners: Male    Birth control/ protection: Surgical   Other Topics Concern  . 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 Married  1969 - 10 yr/divorce. Remarried '83 - 10 yrs/divorced. Remarried '97 -. 1 dtr - '74. 2 grandchildren. Work - volunteering. Marriage in good health.       EXAM:  BP 102/80 (BP Location: Right Arm, Patient Position: Sitting, Cuff Size: Normal)   Pulse 68   Temp 98.1 F (36.7 C) (Oral)   Ht 5' 5.5" (1.664 m)   Wt 135 lb 12.8 oz (61.6 kg)   BMI 22.25 kg/m   Body mass index is 22.25 kg/m.  Physical Exam: Vital signs reviewed XYI:AXKP is a well-developed well-nourished alert cooperative   who appears stated age in no acute distress.  HEENT: normocephalic atraumatic , Eyes: PERRL EOM's full, conjunctiva clear, Nares: paten,t no deformity discharge or tenderness., Ears: no deformity EAC's clear TMs with normal landmarks. Mouth: clear OP, no lesions, edema.  Moist mucous membranes. Dentition in adequate repair. NECK: supple without masses, thyromegaly or bruits. CHEST/PULM:  Clear to auscultation and percussion breath sounds equal no wheeze , rales or rhonchi. No chest wall deformities or tenderness.Breast: normal by inspection . No dimpling, discharge, masses, tenderness or discharge . CV: PMI is nondisplaced, S1 S2 no gallops, murmurs, rubs. Peripheral pulses are full without delay.No JVD .  ABDOMEN: Bowel sounds normal nontender  No guard or rebound, no hepato splenomegal no CVA tenderness.   Extremtities:  No clubbing cyanosis or edema, no acute joint swelling or redness no focal atrophy NEURO:  Oriented x3, cranial nerves 3-12 appear to be intact, no obvious focal weakness,gait within normal limits no abnormal reflexes or asymmetrical SKIN: No acute rashes normal turgor, color, no bruising or petechiae. PSYCH: Oriented, good eye contact, no obvious depression anxiety, cognition and judgment appear normal. LN: no cervical axillary inguinal adenopathy No noted deficits in memory, attention, and speech.   Lab Results  Component Value Date   WBC 8.4 07/09/2016   HGB 14.6 07/09/2016    HCT 43.0 07/09/2016   PLT 235 07/09/2016   GLUCOSE 109 (H) 07/21/2016   CHOL 175 07/21/2016   TRIG 64.0 07/21/2016   HDL 57.20 07/21/2016   LDLCALC 105 (H) 07/21/2016   ALT 29 07/21/2016   AST 15 07/21/2016   NA 140 07/21/2016   K 4.0 07/21/2016   CL 105 07/21/2016   CREATININE 0.69 07/21/2016   BUN 15 07/21/2016   CO2 30 07/21/2016   TSH 3.58 07/15/2015   HGBA1C 5.5 07/13/2016    ASSESSMENT AND PLAN:  Discussed the following assessment and plan:  Visit for preventive health examination - Plan: Basic metabolic panel, Hepatic function panel, Lipid panel  Hyperglycemia - Plan: Basic metabolic panel, Hepatic  function panel, Lipid panel  Renal stone - Plan: Basic metabolic panel, Hepatic function panel, Lipid panel  Osteoarthritis, unspecified osteoarthritis type, unspecified site - Plan: Basic metabolic panel, Hepatic function panel, Lipid panel  Medication management - Plan: Basic metabolic panel, Hepatic function panel, Lipid panel attentino to lsi  And conatct uro about leaving the x-ray potentially indicates she has passed the stone. Her baseline habits etc. appeared to be healthy and to continue. On the CT there was apparently a small amount of plaque buildup. Will review.  Showed trace amount.  Discussed tetanus due next year in the new shingles vaccine to check with insurance about location of administration prescription if needed. Patient Care Team: Jader Desai, Standley Brooking, MD as PCP - General (Internal Medicine) Irene Shipper, MD (Gastroenterology) Jari Pigg, MD (Dermatology) Stefanie Libel, MD (Sports Medicine) Mosetta Anis, MD (Allergy) Leta Baptist, MD (Otolaryngology) Luberta Mutter, MD as Consulting Physician (Ophthalmology)  Allergies as of 07/21/2016      Reactions   Penicillins    REACTION: Hives      Medication List       Accurate as of 07/21/16  6:35 PM. Always use your most recent med list.          levocetirizine 5 MG tablet Commonly known as:   XYZAL Take 1 tablet (5 mg total) by mouth every evening.   montelukast 10 MG tablet Commonly known as:  SINGULAIR Take 1 tablet (10 mg total) by mouth at bedtime.   tamsulosin 0.4 MG Caps capsule Commonly known as:  FLOMAX Take 1 capsule (0.4 mg total) by mouth daily.       Patient Instructions   Contact the urology office and request to move up the timing of the x-ray report ordered by the urologist. Check into reimbursement for tetanus booster and showing Grix the new shingles vaccine usually have to be obtained at a pharmacy. To be covered.    Continue lifestyle intervention healthy eating and exercise . Healthy lifestyle includes : At least 150 minutes of exercise weeks  , weight at healthy levels, which is usually   BMI 19-25. Avoid trans fats and processed foods;  Increase fresh fruits and veges to 5 servings per day. And avoid sweet beverages including tea and juice. Mediterranean diet with olive oil and nuts have been noted to be heart and brain healthy . Avoid tobacco products . Limit  alcohol to  7 per week for women and 14 servings for men.  Get adequate sleep . Wear seat belts . Don't text and drive .  Will notify you  of labs when available. If all ok then yearly check   Health Maintenance, Female Adopting a healthy lifestyle and getting preventive care can go a long way to promote health and wellness. Talk with your health care provider about what schedule of regular examinations is right for you. This is a good chance for you to check in with your provider about disease prevention and staying healthy. In between checkups, there are plenty of things you can do on your own. Experts have done a lot of research about which lifestyle changes and preventive measures are most likely to keep you healthy. Ask your health care provider for more information. Weight and diet Eat a healthy diet  Be sure to include plenty of vegetables, fruits, low-fat dairy products, and lean  protein.  Do not eat a lot of foods high in solid fats, added sugars, or salt.  Get regular exercise. This is one  of the most important things you can do for your health.  Most adults should exercise for at least 150 minutes each week. The exercise should increase your heart rate and make you sweat (moderate-intensity exercise).  Most adults should also do strengthening exercises at least twice a week. This is in addition to the moderate-intensity exercise. Maintain a healthy weight  Body mass index (BMI) is a measurement that can be used to identify possible weight problems. It estimates body fat based on height and weight. Your health care provider can help determine your BMI and help you achieve or maintain a healthy weight.  For females 69 years of age and older:  A BMI below 18.5 is considered underweight.  A BMI of 18.5 to 24.9 is normal.  A BMI of 25 to 29.9 is considered overweight.  A BMI of 30 and above is considered obese. Watch levels of cholesterol and blood lipids  You should start having your blood tested for lipids and cholesterol at 71 years of age, then have this test every 5 years.  You may need to have your cholesterol levels checked more often if:  Your lipid or cholesterol levels are high.  You are older than 71 years of age.  You are at high risk for heart disease. Cancer screening Lung Cancer  Lung cancer screening is recommended for adults 13-51 years old who are at high risk for lung cancer because of a history of smoking.  A yearly low-dose CT scan of the lungs is recommended for people who:  Currently smoke.  Have quit within the past 15 years.  Have at least a 30-pack-year history of smoking. A pack year is smoking an average of one pack of cigarettes a day for 1 year.  Yearly screening should continue until it has been 15 years since you quit.  Yearly screening should stop if you develop a health problem that would prevent you from having  lung cancer treatment. Breast Cancer  Practice breast self-awareness. This means understanding how your breasts normally appear and feel.  It also means doing regular breast self-exams. Let your health care provider know about any changes, no matter how small.  If you are in your 20s or 30s, you should have a clinical breast exam (CBE) by a health care provider every 1-3 years as part of a regular health exam.  If you are 53 or older, have a CBE every year. Also consider having a breast X-ray (mammogram) every year.  If you have a family history of breast cancer, talk to your health care provider about genetic screening.  If you are at high risk for breast cancer, talk to your health care provider about having an MRI and a mammogram every year.  Breast cancer gene (BRCA) assessment is recommended for women who have family members with BRCA-related cancers. BRCA-related cancers include:  Breast.  Ovarian.  Tubal.  Peritoneal cancers.  Results of the assessment will determine the need for genetic counseling and BRCA1 and BRCA2 testing. Cervical Cancer  Your health care provider may recommend that you be screened regularly for cancer of the pelvic organs (ovaries, uterus, and vagina). This screening involves a pelvic examination, including checking for microscopic changes to the surface of your cervix (Pap test). You may be encouraged to have this screening done every 3 years, beginning at age 41.  For women ages 79-65, health care providers may recommend pelvic exams and Pap testing every 3 years, or they may recommend the Pap and  pelvic exam, combined with testing for human papilloma virus (HPV), every 5 years. Some types of HPV increase your risk of cervical cancer. Testing for HPV may also be done on women of any age with unclear Pap test results.  Other health care providers may not recommend any screening for nonpregnant women who are considered low risk for pelvic cancer and who do  not have symptoms. Ask your health care provider if a screening pelvic exam is right for you.  If you have had past treatment for cervical cancer or a condition that could lead to cancer, you need Pap tests and screening for cancer for at least 20 years after your treatment. If Pap tests have been discontinued, your risk factors (such as having a new sexual partner) need to be reassessed to determine if screening should resume. Some women have medical problems that increase the chance of getting cervical cancer. In these cases, your health care provider may recommend more frequent screening and Pap tests. Colorectal Cancer  This type of cancer can be detected and often prevented.  Routine colorectal cancer screening usually begins at 71 years of age and continues through 71 years of age.  Your health care provider may recommend screening at an earlier age if you have risk factors for colon cancer.  Your health care provider may also recommend using home test kits to check for hidden blood in the stool.  A small camera at the end of a tube can be used to examine your colon directly (sigmoidoscopy or colonoscopy). This is done to check for the earliest forms of colorectal cancer.  Routine screening usually begins at age 6.  Direct examination of the colon should be repeated every 5-10 years through 71 years of age. However, you may need to be screened more often if early forms of precancerous polyps or small growths are found. Skin Cancer  Check your skin from head to toe regularly.  Tell your health care provider about any new moles or changes in moles, especially if there is a change in a mole's shape or color.  Also tell your health care provider if you have a mole that is larger than the size of a pencil eraser.  Always use sunscreen. Apply sunscreen liberally and repeatedly throughout the day.  Protect yourself by wearing long sleeves, pants, a wide-brimmed hat, and sunglasses  whenever you are outside. Heart disease, diabetes, and high blood pressure  High blood pressure causes heart disease and increases the risk of stroke. High blood pressure is more likely to develop in:  People who have blood pressure in the high end of the normal range (130-139/85-89 mm Hg).  People who are overweight or obese.  People who are African American.  If you are 35-14 years of age, have your blood pressure checked every 3-5 years. If you are 23 years of age or older, have your blood pressure checked every year. You should have your blood pressure measured twice-once when you are at a hospital or clinic, and once when you are not at a hospital or clinic. Record the average of the two measurements. To check your blood pressure when you are not at a hospital or clinic, you can use:  An automated blood pressure machine at a pharmacy.  A home blood pressure monitor.  If you are between 55 years and 53 years old, ask your health care provider if you should take aspirin to prevent strokes.  Have regular diabetes screenings. This involves taking a blood  sample to check your fasting blood sugar level.  If you are at a normal weight and have a low risk for diabetes, have this test once every three years after 71 years of age.  If you are overweight and have a high risk for diabetes, consider being tested at a younger age or more often. Preventing infection Hepatitis B  If you have a higher risk for hepatitis B, you should be screened for this virus. You are considered at high risk for hepatitis B if:  You were born in a country where hepatitis B is common. Ask your health care provider which countries are considered high risk.  Your parents were born in a high-risk country, and you have not been immunized against hepatitis B (hepatitis B vaccine).  You have HIV or AIDS.  You use needles to inject street drugs.  You live with someone who has hepatitis B.  You have had sex with  someone who has hepatitis B.  You get hemodialysis treatment.  You take certain medicines for conditions, including cancer, organ transplantation, and autoimmune conditions. Hepatitis C  Blood testing is recommended for:  Everyone born from [birth year] through 1965.  Anyone with known risk factors for hepatitis C. Sexually transmitted infections (STIs)  You should be screened for sexually transmitted infections (STIs) including gonorrhea and chlamydia if:  You are sexually active and are younger than 71 years of age.  You are older than 71 years of age and your health care provider tells you that you are at risk for this type of infection.  Your sexual activity has changed since you were last screened and you are at an increased risk for chlamydia or gonorrhea. Ask your health care provider if you are at risk.  If you do not have HIV, but are at risk, it may be recommended that you take a prescription medicine daily to prevent HIV infection. This is called pre-exposure prophylaxis (PrEP). You are considered at risk if:  You are sexually active and do not regularly use condoms or know the HIV status of your partner(s).  You take drugs by injection.  You are sexually active with a partner who has HIV. Talk with your health care provider about whether you are at high risk of being infected with HIV. If you choose to begin PrEP, you should first be tested for HIV. You should then be tested every 3 months for as long as you are taking PrEP. Pregnancy  If you are premenopausal and you may become pregnant, ask your health care provider about preconception counseling.  If you may become pregnant, take 400 to 800 micrograms (mcg) of folic acid every day.  If you want to prevent pregnancy, talk to your health care provider about birth control (contraception). Osteoporosis and menopause  Osteoporosis is a disease in which the bones lose minerals and strength with aging. This can result in  serious bone fractures. Your risk for osteoporosis can be identified using a bone density scan.  If you are 76 years of age or older, or if you are at risk for osteoporosis and fractures, ask your health care provider if you should be screened.  Ask your health care provider whether you should take a calcium or vitamin D supplement to lower your risk for osteoporosis.  Menopause may have certain physical symptoms and risks.  Hormone replacement therapy may reduce some of these symptoms and risks. Talk to your health care provider about whether hormone replacement therapy is right for you.  Follow these instructions at home:  Schedule regular health, dental, and eye exams.  Stay current with your immunizations.  Do not use any tobacco products including cigarettes, chewing tobacco, or electronic cigarettes.  If you are pregnant, do not drink alcohol.  If you are breastfeeding, limit how much and how often you drink alcohol.  Limit alcohol intake to no more than 1 drink per day for nonpregnant women. One drink equals 12 ounces of beer, 5 ounces of wine, or 1 ounces of hard liquor.  Do not use street drugs.  Do not share needles.  Ask your health care provider for help if you need support or information about quitting drugs.  Tell your health care provider if you often feel depressed.  Tell your health care provider if you have ever been abused or do not feel safe at home. This information is not intended to replace advice given to you by your health care provider. Make sure you discuss any questions you have with your health care provider. Document Released: 08/24/2010 Document Revised: 07/17/2015 Document Reviewed: 11/12/2014 Elsevier Interactive Patient Education  2017 [place]. Kyeshia Zinn M.D.

## 2016-07-21 ENCOUNTER — Encounter: Payer: Self-pay | Admitting: Internal Medicine

## 2016-07-21 ENCOUNTER — Ambulatory Visit (INDEPENDENT_AMBULATORY_CARE_PROVIDER_SITE_OTHER): Payer: PPO | Admitting: Internal Medicine

## 2016-07-21 VITALS — BP 102/80 | HR 68 | Temp 98.1°F | Ht 65.5 in | Wt 135.8 lb

## 2016-07-21 DIAGNOSIS — N200 Calculus of kidney: Secondary | ICD-10-CM | POA: Diagnosis not present

## 2016-07-21 DIAGNOSIS — M1990 Unspecified osteoarthritis, unspecified site: Secondary | ICD-10-CM

## 2016-07-21 DIAGNOSIS — Z79899 Other long term (current) drug therapy: Secondary | ICD-10-CM

## 2016-07-21 DIAGNOSIS — R739 Hyperglycemia, unspecified: Secondary | ICD-10-CM | POA: Diagnosis not present

## 2016-07-21 DIAGNOSIS — Z0000 Encounter for general adult medical examination without abnormal findings: Secondary | ICD-10-CM | POA: Diagnosis not present

## 2016-07-21 LAB — LIPID PANEL
CHOL/HDL RATIO: 3
CHOLESTEROL: 175 mg/dL (ref 0–200)
HDL: 57.2 mg/dL (ref >39.00)
LDL Cholesterol: 105 mg/dL — ABNORMAL HIGH (ref 0–99)
NonHDL: 117.69
TRIGLYCERIDES: 64 mg/dL (ref 0.0–149.0)
VLDL: 12.8 mg/dL (ref 0.0–40.0)

## 2016-07-21 LAB — HEPATIC FUNCTION PANEL
ALT: 29 U/L (ref 0–35)
AST: 15 U/L (ref 0–37)
Albumin: 4.4 g/dL (ref 3.5–5.2)
Alkaline Phosphatase: 78 U/L (ref 39–117)
BILIRUBIN DIRECT: 0.1 mg/dL (ref 0.0–0.3)
BILIRUBIN TOTAL: 0.5 mg/dL (ref 0.2–1.2)
TOTAL PROTEIN: 6.9 g/dL (ref 6.0–8.3)

## 2016-07-21 LAB — BASIC METABOLIC PANEL
BUN: 15 mg/dL (ref 6–23)
CO2: 30 meq/L (ref 19–32)
CREATININE: 0.69 mg/dL (ref 0.40–1.20)
Calcium: 9.5 mg/dL (ref 8.4–10.5)
Chloride: 105 mEq/L (ref 96–112)
GFR: 89.05 mL/min (ref >60.00)
Glucose, Bld: 109 mg/dL — ABNORMAL HIGH (ref 70–99)
Potassium: 4 mEq/L (ref 3.5–5.1)
Sodium: 140 mEq/L (ref 135–145)

## 2016-07-21 NOTE — Patient Instructions (Addendum)
Contact the urology office and request to move up the timing of the x-ray report ordered by the urologist. Check into reimbursement for tetanus booster and showing Grix the new shingles vaccine usually have to be obtained at a pharmacy. To be covered.    Continue lifestyle intervention healthy eating and exercise . Healthy lifestyle includes : At least 150 minutes of exercise weeks  , weight at healthy levels, which is usually   BMI 19-25. Avoid trans fats and processed foods;  Increase fresh fruits and veges to 5 servings per day. And avoid sweet beverages including tea and juice. Mediterranean diet with olive oil and nuts have been noted to be heart and brain healthy . Avoid tobacco products . Limit  alcohol to  7 per week for women and 14 servings for men.  Get adequate sleep . Wear seat belts . Don't text and drive .  Will notify you  of labs when available. If all ok then yearly check   Health Maintenance, Female Adopting a healthy lifestyle and getting preventive care can go a long way to promote health and wellness. Talk with your health care provider about what schedule of regular examinations is right for you. This is a good chance for you to check in with your provider about disease prevention and staying healthy. In between checkups, there are plenty of things you can do on your own. Experts have done a lot of research about which lifestyle changes and preventive measures are most likely to keep you healthy. Ask your health care provider for more information. Weight and diet Eat a healthy diet  Be sure to include plenty of vegetables, fruits, low-fat dairy products, and lean protein.  Do not eat a lot of foods high in solid fats, added sugars, or salt.  Get regular exercise. This is one of the most important things you can do for your health.  Most adults should exercise for at least 150 minutes each week. The exercise should increase your heart rate and make you sweat  (moderate-intensity exercise).  Most adults should also do strengthening exercises at least twice a week. This is in addition to the moderate-intensity exercise. Maintain a healthy weight  Body mass index (BMI) is a measurement that can be used to identify possible weight problems. It estimates body fat based on height and weight. Your health care provider can help determine your BMI and help you achieve or maintain a healthy weight.  For females 71 years of age and older:  A BMI below 18.5 is considered underweight.  A BMI of 18.5 to 24.9 is normal.  A BMI of 25 to 29.9 is considered overweight.  A BMI of 30 and above is considered obese. Watch levels of cholesterol and blood lipids  You should start having your blood tested for lipids and cholesterol at 71 years of age, then have this test every 5 years.  You may need to have your cholesterol levels checked more often if:  Your lipid or cholesterol levels are high.  You are older than 71 years of age.  You are at high risk for heart disease. Cancer screening Lung Cancer  Lung cancer screening is recommended for adults 53-29 years old who are at high risk for lung cancer because of a history of smoking.  A yearly low-dose CT scan of the lungs is recommended for people who:  Currently smoke.  Have quit within the past 15 years.  Have at least a 30-pack-year history of smoking. A  pack year is smoking an average of one pack of cigarettes a day for 1 year.  Yearly screening should continue until it has been 15 years since you quit.  Yearly screening should stop if you develop a health problem that would prevent you from having lung cancer treatment. Breast Cancer  Practice breast self-awareness. This means understanding how your breasts normally appear and feel.  It also means doing regular breast self-exams. Let your health care provider know about any changes, no matter how small.  If you are in your 20s or 30s, you  should have a clinical breast exam (CBE) by a health care provider every 1-3 years as part of a regular health exam.  If you are 55 or older, have a CBE every year. Also consider having a breast X-ray (mammogram) every year.  If you have a family history of breast cancer, talk to your health care provider about genetic screening.  If you are at high risk for breast cancer, talk to your health care provider about having an MRI and a mammogram every year.  Breast cancer gene (BRCA) assessment is recommended for women who have family members with BRCA-related cancers. BRCA-related cancers include:  Breast.  Ovarian.  Tubal.  Peritoneal cancers.  Results of the assessment will determine the need for genetic counseling and BRCA1 and BRCA2 testing. Cervical Cancer  Your health care provider may recommend that you be screened regularly for cancer of the pelvic organs (ovaries, uterus, and vagina). This screening involves a pelvic examination, including checking for microscopic changes to the surface of your cervix (Pap test). You may be encouraged to have this screening done every 3 years, beginning at age 23.  For women ages 36-65, health care providers may recommend pelvic exams and Pap testing every 3 years, or they may recommend the Pap and pelvic exam, combined with testing for human papilloma virus (HPV), every 5 years. Some types of HPV increase your risk of cervical cancer. Testing for HPV may also be done on women of any age with unclear Pap test results.  Other health care providers may not recommend any screening for nonpregnant women who are considered low risk for pelvic cancer and who do not have symptoms. Ask your health care provider if a screening pelvic exam is right for you.  If you have had past treatment for cervical cancer or a condition that could lead to cancer, you need Pap tests and screening for cancer for at least 20 years after your treatment. If Pap tests have been  discontinued, your risk factors (such as having a new sexual partner) need to be reassessed to determine if screening should resume. Some women have medical problems that increase the chance of getting cervical cancer. In these cases, your health care provider may recommend more frequent screening and Pap tests. Colorectal Cancer  This type of cancer can be detected and often prevented.  Routine colorectal cancer screening usually begins at 71 years of age and continues through 71 years of age.  Your health care provider may recommend screening at an earlier age if you have risk factors for colon cancer.  Your health care provider may also recommend using home test kits to check for hidden blood in the stool.  A small camera at the end of a tube can be used to examine your colon directly (sigmoidoscopy or colonoscopy). This is done to check for the earliest forms of colorectal cancer.  Routine screening usually begins at age 73.  Direct  examination of the colon should be repeated every 5-10 years through 71 years of age. However, you may need to be screened more often if early forms of precancerous polyps or small growths are found. Skin Cancer  Check your skin from head to toe regularly.  Tell your health care provider about any new moles or changes in moles, especially if there is a change in a mole's shape or color.  Also tell your health care provider if you have a mole that is larger than the size of a pencil eraser.  Always use sunscreen. Apply sunscreen liberally and repeatedly throughout the day.  Protect yourself by wearing long sleeves, pants, a wide-brimmed hat, and sunglasses whenever you are outside. Heart disease, diabetes, and high blood pressure  High blood pressure causes heart disease and increases the risk of stroke. High blood pressure is more likely to develop in:  People who have blood pressure in the high end of the normal range (130-139/85-89 mm Hg).  People  who are overweight or obese.  People who are African American.  If you are 40-4 years of age, have your blood pressure checked every 3-5 years. If you are 45 years of age or older, have your blood pressure checked every year. You should have your blood pressure measured twice-once when you are at a hospital or clinic, and once when you are not at a hospital or clinic. Record the average of the two measurements. To check your blood pressure when you are not at a hospital or clinic, you can use:  An automated blood pressure machine at a pharmacy.  A home blood pressure monitor.  If you are between 44 years and 8 years old, ask your health care provider if you should take aspirin to prevent strokes.  Have regular diabetes screenings. This involves taking a blood sample to check your fasting blood sugar level.  If you are at a normal weight and have a low risk for diabetes, have this test once every three years after 71 years of age.  If you are overweight and have a high risk for diabetes, consider being tested at a younger age or more often. Preventing infection Hepatitis B  If you have a higher risk for hepatitis B, you should be screened for this virus. You are considered at high risk for hepatitis B if:  You were born in a country where hepatitis B is common. Ask your health care provider which countries are considered high risk.  Your parents were born in a high-risk country, and you have not been immunized against hepatitis B (hepatitis B vaccine).  You have HIV or AIDS.  You use needles to inject street drugs.  You live with someone who has hepatitis B.  You have had sex with someone who has hepatitis B.  You get hemodialysis treatment.  You take certain medicines for conditions, including cancer, organ transplantation, and autoimmune conditions. Hepatitis C  Blood testing is recommended for:  Everyone born from [birth year] through 1965.  Anyone with known risk factors for  hepatitis C. Sexually transmitted infections (STIs)  You should be screened for sexually transmitted infections (STIs) including gonorrhea and chlamydia if:  You are sexually active and are younger than 71 years of age.  You are older than 71 years of age and your health care provider tells you that you are at risk for this type of infection.  Your sexual activity has changed since you were last screened and you are at an increased  risk for chlamydia or gonorrhea. Ask your health care provider if you are at risk.  If you do not have HIV, but are at risk, it may be recommended that you take a prescription medicine daily to prevent HIV infection. This is called pre-exposure prophylaxis (PrEP). You are considered at risk if:  You are sexually active and do not regularly use condoms or know the HIV status of your partner(s).  You take drugs by injection.  You are sexually active with a partner who has HIV. Talk with your health care provider about whether you are at high risk of being infected with HIV. If you choose to begin PrEP, you should first be tested for HIV. You should then be tested every 3 months for as long as you are taking PrEP. Pregnancy  If you are premenopausal and you may become pregnant, ask your health care provider about preconception counseling.  If you may become pregnant, take 400 to 800 micrograms (mcg) of folic acid every day.  If you want to prevent pregnancy, talk to your health care provider about birth control (contraception). Osteoporosis and menopause  Osteoporosis is a disease in which the bones lose minerals and strength with aging. This can result in serious bone fractures. Your risk for osteoporosis can be identified using a bone density scan.  If you are 84 years of age or older, or if you are at risk for osteoporosis and fractures, ask your health care provider if you should be screened.  Ask your health care provider whether you should take a calcium  or vitamin D supplement to lower your risk for osteoporosis.  Menopause may have certain physical symptoms and risks.  Hormone replacement therapy may reduce some of these symptoms and risks. Talk to your health care provider about whether hormone replacement therapy is right for you. Follow these instructions at home:  Schedule regular health, dental, and eye exams.  Stay current with your immunizations.  Do not use any tobacco products including cigarettes, chewing tobacco, or electronic cigarettes.  If you are pregnant, do not drink alcohol.  If you are breastfeeding, limit how much and how often you drink alcohol.  Limit alcohol intake to no more than 1 drink per day for nonpregnant women. One drink equals 12 ounces of beer, 5 ounces of wine, or 1 ounces of hard liquor.  Do not use street drugs.  Do not share needles.  Ask your health care provider for help if you need support or information about quitting drugs.  Tell your health care provider if you often feel depressed.  Tell your health care provider if you have ever been abused or do not feel safe at home. This information is not intended to replace advice given to you by your health care provider. Make sure you discuss any questions you have with your health care provider. Document Released: 08/24/2010 Document Revised: 07/17/2015 Document Reviewed: 11/12/2014 Elsevier Interactive Patient Education  2017 [employer].

## 2016-08-31 ENCOUNTER — Telehealth: Payer: Self-pay | Admitting: Internal Medicine

## 2016-08-31 NOTE — Telephone Encounter (Signed)
° ° °  Pt said her pharmacy told her that she a RX to get the new shingrix vaccine. She call to ask for the RX

## 2016-09-01 NOTE — Telephone Encounter (Signed)
Please send in rx for shingrix with refill

## 2016-09-01 NOTE — Telephone Encounter (Signed)
Spoke with the pharmacist regarding rx for shingrix and they protocol is patient need and rx in order to receive the vaccination.

## 2016-09-02 ENCOUNTER — Other Ambulatory Visit: Payer: Self-pay | Admitting: Emergency Medicine

## 2016-09-02 MED ORDER — ZOSTER VAC RECOMB ADJUVANTED 50 MCG/0.5ML IM SUSR
0.5000 mL | Freq: Once | INTRAMUSCULAR | 1 refills | Status: AC
Start: 2016-09-02 — End: 2016-09-02

## 2016-09-02 NOTE — Telephone Encounter (Signed)
Sent in a rx for the Shingrix vaccine. Left a detailed VM for patient regarding the rx being sent and if she has a problem just to give the office a call back

## 2016-11-12 ENCOUNTER — Encounter: Payer: Self-pay | Admitting: Internal Medicine

## 2016-12-06 ENCOUNTER — Other Ambulatory Visit: Payer: Self-pay | Admitting: Internal Medicine

## 2016-12-06 DIAGNOSIS — Z1231 Encounter for screening mammogram for malignant neoplasm of breast: Secondary | ICD-10-CM

## 2017-01-10 ENCOUNTER — Ambulatory Visit: Payer: PPO

## 2017-01-20 DIAGNOSIS — Z23 Encounter for immunization: Secondary | ICD-10-CM | POA: Diagnosis not present

## 2017-01-20 DIAGNOSIS — L719 Rosacea, unspecified: Secondary | ICD-10-CM | POA: Diagnosis not present

## 2017-01-20 DIAGNOSIS — D225 Melanocytic nevi of trunk: Secondary | ICD-10-CM | POA: Diagnosis not present

## 2017-01-20 DIAGNOSIS — D2261 Melanocytic nevi of right upper limb, including shoulder: Secondary | ICD-10-CM | POA: Diagnosis not present

## 2017-01-20 DIAGNOSIS — Z411 Encounter for cosmetic surgery: Secondary | ICD-10-CM | POA: Diagnosis not present

## 2017-01-20 DIAGNOSIS — D2272 Melanocytic nevi of left lower limb, including hip: Secondary | ICD-10-CM | POA: Diagnosis not present

## 2017-01-20 DIAGNOSIS — Z86018 Personal history of other benign neoplasm: Secondary | ICD-10-CM | POA: Diagnosis not present

## 2017-01-24 ENCOUNTER — Ambulatory Visit
Admission: RE | Admit: 2017-01-24 | Discharge: 2017-01-24 | Disposition: A | Discharge status: Home / Self Care | Payer: PPO | Source: Ambulatory Visit | Attending: Internal Medicine | Admitting: Internal Medicine

## 2017-01-24 DIAGNOSIS — Z1231 Encounter for screening mammogram for malignant neoplasm of breast: Secondary | ICD-10-CM

## 2017-04-20 DIAGNOSIS — J3089 Other allergic rhinitis: Secondary | ICD-10-CM | POA: Diagnosis not present

## 2017-05-03 DIAGNOSIS — H04221 Epiphora due to insufficient drainage, right lacrimal gland: Secondary | ICD-10-CM | POA: Diagnosis not present

## 2017-05-03 DIAGNOSIS — H2513 Age-related nuclear cataract, bilateral: Secondary | ICD-10-CM | POA: Diagnosis not present

## 2017-05-30 DIAGNOSIS — H04561 Stenosis of right lacrimal punctum: Secondary | ICD-10-CM | POA: Diagnosis not present

## 2017-07-19 NOTE — Progress Notes (Signed)
Chief Complaint  Patient presents with  . Annual Exam    HPI: Cynthia Miller 72 y.o. comes in today for Preventive Medicare exam/ wellness visit .Since last visit.  Doing well.   continueing  healhty   ls  Health Maintenance  Topic Date Due  . INFLUENZA VACCINE  09/22/2017  . TETANUS/TDAP  11/06/2017  . MAMMOGRAM  01/25/2019  . COLONOSCOPY  07/22/2020  . DEXA SCAN  Completed  . Hepatitis C Screening  Completed  . PNA vac Low Risk Adult  Completed   Health Maintenance Review LIFESTYLE:  Exercise:  4 miles  3 d and yoga  Tobacco/ETS: no Alcohol:  Wine ocass light beer  Sugar beverages:  no Sleep:   Ave  7  Drug use: no HH:  2   1 cat    Derm [place]  Checks this year   Hearing: fine     Vision:  No limitations at present . Last eye check UTD stopped  Tear duck  punctoplaty  Cataracts right eye   Safety:  Has smoke detector and wears seat belts.   No excess sun exposure. Sees dentist regularly.  Preventive parameters: up-to-date  Reviewed   ADLS:   There are no problems or need for assistance  driving, feeding, obtaining food, dressing, toileting and bathing, managing money using phone. She is independent.   ROS:  GEN/ HEENT: No fever, significant weight changes sweats headaches vision problems hearing changes, CV/ PULM; No chest pain shortness of breath cough, syncope,edema  change in exercise tolerance. GI /GU: No adominal pain, vomiting, change in bowel habits. No blood in the stool. No significant GU symptoms. SKIN/HEME: ,no acute skin rashes suspicious lesions or bleeding. No lymphadenopathy, nodules, masses.  NEURO/ PSYCH:  No neurologic signs such as weakness numbness. No depression anxiety. IMM/ Allergy: No unusual infections.  Allergy .   REST of 12 system review negative except as per HPI   Past Medical History:  Diagnosis Date  . ALLERGIC RHINITIS   . CIN I (cervical intraepithelial neoplasia I)   . FIBROCYSTIC BREAST DISEASE   . Hx of varicella     . Kidney stones 07/08/2016  . Low blood pressure   . Osteoarthritis    THUMB  . Osteopenia   . Tinnitus of both ears    L>R, follows with ENT for same    Family History  Problem Relation Age of Onset  . Diverticulosis Father   . Lung cancer Father        brain mets,did smoke  . Hyperlipidemia Mother        died age 43  . Thyroid disease Mother   . Breast cancer Paternal Grandmother   . Healthy Brother   . Colon cancer Neg Hx     Social History   Socioeconomic History  . Marital status: Married    Spouse name: Not on file  . Number of children: 1  . Years of education: 33  . Highest education level: Not on file  Occupational History  . Occupation: Environmental education officer: RETIRED  Social Needs  . Financial resource strain: Not on file  . Food insecurity:    Worry: Not on file    Inability: Not on file  . Transportation needs:    Medical: Not on file    Non-medical: Not on file  Tobacco Use  . Smoking status: Never Smoker  . Smokeless tobacco: Never Used  Substance and Sexual Activity  . Alcohol use: Yes  Alcohol/week: 8.4 oz    Types: 14 Glasses of wine per week  . Drug use: No  . Sexual activity: Yes    Partners: Male    Birth control/protection: Surgical  Lifestyle  . Physical activity:    Days per week: Not on file    Minutes per session: Not on file  . Stress: Not on file  Relationships  . Social connections:    Talks on phone: Not on file    Gets together: Not on file    Attends religious service: Not on file    Active member of club or organization: Not on file    Attends meetings of clubs or organizations: Not on file    Relationship status: Not on file  Other Topics Concern  . Not on file  Social History Narrative   [employer], [employer] in [place] to finish BA Art history. Married 1969 - 10 yr/divorce. Remarried '83 - 10 yrs/divorced. Remarried '97 -. 1 dtr - '74. 2 grandchildren. Work - volunteering. Marriage in good  health.     Outpatient Encounter Medications as of 07/22/2017  Medication Sig  . levocetirizine (XYZAL) 5 MG tablet Take 1 tablet (5 mg total) by mouth every evening.  . montelukast (SINGULAIR) 10 MG tablet Take 1 tablet (10 mg total) by mouth at bedtime.  . tamsulosin (FLOMAX) 0.4 MG CAPS capsule Take 1 capsule (0.4 mg total) by mouth daily.   No facility-administered encounter medications on file as of 07/22/2017.     EXAM:  BP 110/60 (BP Location: Right Arm, Patient Position: Sitting, Cuff Size: Normal)   Pulse 65   Temp 98 F (36.7 C) (Oral)   Ht 5' 2.5" (1.588 m)   Wt 137 lb 6.4 oz (62.3 kg)   BMI 24.73 kg/m   Body mass index is 24.73 kg/m.  Physical Exam: Vital signs reviewed CHE:NIDP is a well-developed well-nourished alert cooperative   who appears stated age in no acute distress.  HEENT: normocephalic atraumatic , Eyes: PERRL EOM's full, conjunctiva clear, Nares: paten,t no deformity discharge or tenderness., Ears: no deformity EAC's clear TMs with normal landmarks. Mouth: clear OP, no lesions, edema.  Moist mucous membranes. Dentition in adequate repair. NECK: supple without masses, thyromegaly or bruits. CHEST/PULM:  Clear to auscultation and percussion breath sounds equal no wheeze , rales or rhonchi. No chest wall deformities or tenderness. CV: PMI is nondisplaced, S1 S2 no gallops, murmurs, rubs. Peripheral pulses are full without delay.No JVD .  ABDOMEN: Bowel sounds normal nontender  No guard or rebound, no hepato splenomegal no CVA tenderness.   Extremtities:  No clubbing cyanosis or edema, no acute joint swelling or redness no focal atrophy NEURO:  Oriented x3, cranial nerves 3-12 appear to be intact, no obvious focal weakness,gait within normal limits no abnormal reflexes or asymmetrical SKIN: No acute rashes normal turgor, color, no bruising or petechiae. PSYCH: Oriented, good eye contact, no obvious depression anxiety, cognition and judgment appear normal. LN:  no cervical axillary inguinal adenopathy No noted deficits in memory, attention, and speech.   Lab Results  Component Value Date   WBC 8.4 07/09/2016   HGB 14.6 07/09/2016   HCT 43.0 07/09/2016   PLT 235 07/09/2016   GLUCOSE 109 (H) 07/21/2016   CHOL 175 07/21/2016   TRIG 64.0 07/21/2016   HDL 57.20 07/21/2016   LDLCALC 105 (H) 07/21/2016   ALT 29 07/21/2016   AST 15 07/21/2016   NA 140 07/21/2016   K 4.0 07/21/2016  CL 105 07/21/2016   CREATININE 0.69 07/21/2016   BUN 15 07/21/2016   CO2 30 07/21/2016   TSH 3.58 07/15/2015   HGBA1C 5.5 07/13/2016    ASSESSMENT AND PLAN:  Discussed the following assessment and plan:  Visit for preventive health examination - last dexa  2017 - Plan: Basic metabolic panel, Lipid panel, TSH, CBC with Differential/Platelet, Hemoglobin A1c, Hepatic function panel  Medication management - Plan: Basic metabolic panel, Lipid panel, TSH, CBC with Differential/Platelet, Hemoglobin A1c, Hepatic function panel  Fasting hyperglycemia - borderline  - Plan: Basic metabolic panel, Lipid panel, TSH, CBC with Differential/Platelet, Hemoglobin A1c, Hepatic function panel  Elevated LDL cholesterol level - favorable ratio  follow  Renal stone - no recurrance  so far   monitor  Disc  future screening may be candidate for cologuard    Patient Care Team: Burnis Medin, MD as PCP - General (Internal Medicine) Irene Shipper, MD (Gastroenterology) Jari Pigg, MD (Dermatology) Stefanie Libel, MD (Sports Medicine) Mosetta Anis, MD (Allergy) Leta Baptist, MD (Otolaryngology) Luberta Mutter, MD as Consulting Physician (Ophthalmology)  Patient Instructions    Glad you are doing well.  Continue lifestyle intervention healthy eating and exercise .   Exam next year    Due for td fall 2019  Discussed  Next  Screen when due for colon screen in 2022   Health Maintenance, Female Adopting a healthy lifestyle and getting preventive care can go a long way to  promote health and wellness. Talk with your health care provider about what schedule of regular examinations is right for you. This is a good chance for you to check in with your provider about disease prevention and staying healthy. In between checkups, there are plenty of things you can do on your own. Experts have done a lot of research about which lifestyle changes and preventive measures are most likely to keep you healthy. Ask your health care provider for more information. Weight and diet Eat a healthy diet  Be sure to include plenty of vegetables, fruits, low-fat dairy products, and lean protein.  Do not eat a lot of foods high in solid fats, added sugars, or salt.  Get regular exercise. This is one of the most important things you can do for your health. ? Most adults should exercise for at least 150 minutes each week. The exercise should increase your heart rate and make you sweat (moderate-intensity exercise). ? Most adults should also do strengthening exercises at least twice a week. This is in addition to the moderate-intensity exercise.  Maintain a healthy weight  Body mass index (BMI) is a measurement that can be used to identify possible weight problems. It estimates body fat based on height and weight. Your health care provider can help determine your BMI and help you achieve or maintain a healthy weight.  For females 17 years of age and older: ? A BMI below 18.5 is considered underweight. ? A BMI of 18.5 to 24.9 is normal. ? A BMI of 25 to 29.9 is considered overweight. ? A BMI of 30 and above is considered obese.  Watch levels of cholesterol and blood lipids  You should start having your blood tested for lipids and cholesterol at 72 years of age, then have this test every 5 years.  You may need to have your cholesterol levels checked more often if: ? Your lipid or cholesterol levels are high. ? You are older than 72 years of age. ? You are at high risk for  heart  disease.  Cancer screening Lung Cancer  Lung cancer screening is recommended for adults 73-25 years old who are at high risk for lung cancer because of a history of smoking.  A yearly low-dose CT scan of the lungs is recommended for people who: ? Currently smoke. ? Have quit within the past 15 years. ? Have at least a 30-pack-year history of smoking. A pack year is smoking an average of one pack of cigarettes a day for 1 year.  Yearly screening should continue until it has been 15 years since you quit.  Yearly screening should stop if you develop a health problem that would prevent you from having lung cancer treatment.  Breast Cancer  Practice breast self-awareness. This means understanding how your breasts normally appear and feel.  It also means doing regular breast self-exams. Let your health care provider know about any changes, no matter how small.  If you are in your 20s or 30s, you should have a clinical breast exam (CBE) by a health care provider every 1-3 years as part of a regular health exam.  If you are 73 or older, have a CBE every year. Also consider having a breast X-ray (mammogram) every year.  If you have a family history of breast cancer, talk to your health care provider about genetic screening.  If you are at high risk for breast cancer, talk to your health care provider about having an MRI and a mammogram every year.  Breast cancer gene (BRCA) assessment is recommended for women who have family members with BRCA-related cancers. BRCA-related cancers include: ? Breast. ? Ovarian. ? Tubal. ? Peritoneal cancers.  Results of the assessment will determine the need for genetic counseling and BRCA1 and BRCA2 testing.  Cervical Cancer Your health care provider may recommend that you be screened regularly for cancer of the pelvic organs (ovaries, uterus, and vagina). This screening involves a pelvic examination, including checking for microscopic changes to the  surface of your cervix (Pap test). You may be encouraged to have this screening done every 3 years, beginning at age 31.  For women ages 57-65, health care providers may recommend pelvic exams and Pap testing every 3 years, or they may recommend the Pap and pelvic exam, combined with testing for human papilloma virus (HPV), every 5 years. Some types of HPV increase your risk of cervical cancer. Testing for HPV may also be done on women of any age with unclear Pap test results.  Other health care providers may not recommend any screening for nonpregnant women who are considered low risk for pelvic cancer and who do not have symptoms. Ask your health care provider if a screening pelvic exam is right for you.  If you have had past treatment for cervical cancer or a condition that could lead to cancer, you need Pap tests and screening for cancer for at least 20 years after your treatment. If Pap tests have been discontinued, your risk factors (such as having a new sexual partner) need to be reassessed to determine if screening should resume. Some women have medical problems that increase the chance of getting cervical cancer. In these cases, your health care provider may recommend more frequent screening and Pap tests.  Colorectal Cancer  This type of cancer can be detected and often prevented.  Routine colorectal cancer screening usually begins at 72 years of age and continues through 72 years of age.  Your health care provider may recommend screening at an earlier age if you  have risk factors for colon cancer.  Your health care provider may also recommend using home test kits to check for hidden blood in the stool.  A small camera at the end of a tube can be used to examine your colon directly (sigmoidoscopy or colonoscopy). This is done to check for the earliest forms of colorectal cancer.  Routine screening usually begins at age 75.  Direct examination of the colon should be repeated every 5-10  years through 72 years of age. However, you may need to be screened more often if early forms of precancerous polyps or small growths are found.  Skin Cancer  Check your skin from head to toe regularly.  Tell your health care provider about any new moles or changes in moles, especially if there is a change in a mole's shape or color.  Also tell your health care provider if you have a mole that is larger than the size of a pencil eraser.  Always use sunscreen. Apply sunscreen liberally and repeatedly throughout the day.  Protect yourself by wearing long sleeves, pants, a wide-brimmed hat, and sunglasses whenever you are outside.  Heart disease, diabetes, and high blood pressure  High blood pressure causes heart disease and increases the risk of stroke. High blood pressure is more likely to develop in: ? People who have blood pressure in the high end of the normal range (130-139/85-89 mm Hg). ? People who are overweight or obese. ? People who are African American.  If you are 66-19 years of age, have your blood pressure checked every 3-5 years. If you are 55 years of age or older, have your blood pressure checked every year. You should have your blood pressure measured twice-once when you are at a hospital or clinic, and once when you are not at a hospital or clinic. Record the average of the two measurements. To check your blood pressure when you are not at a hospital or clinic, you can use: ? An automated blood pressure machine at a pharmacy. ? A home blood pressure monitor.  If you are between 70 years and 5 years old, ask your health care provider if you should take aspirin to prevent strokes.  Have regular diabetes screenings. This involves taking a blood sample to check your fasting blood sugar level. ? If you are at a normal weight and have a low risk for diabetes, have this test once every three years after 72 years of age. ? If you are overweight and have a high risk for  diabetes, consider being tested at a younger age or more often. Preventing infection Hepatitis B  If you have a higher risk for hepatitis B, you should be screened for this virus. You are considered at high risk for hepatitis B if: ? You were born in a country where hepatitis B is common. Ask your health care provider which countries are considered high risk. ? Your parents were born in a high-risk country, and you have not been immunized against hepatitis B (hepatitis B vaccine). ? You have HIV or AIDS. ? You use needles to inject street drugs. ? You live with someone who has hepatitis B. ? You have had sex with someone who has hepatitis B. ? You get hemodialysis treatment. ? You take certain medicines for conditions, including cancer, organ transplantation, and autoimmune conditions.  Hepatitis C  Blood testing is recommended for: ? Everyone born from [birth year] through 1965. ? Anyone with known risk factors for hepatitis C.  Sexually transmitted  infections (STIs)  You should be screened for sexually transmitted infections (STIs) including gonorrhea and chlamydia if: ? You are sexually active and are younger than 72 years of age. ? You are older than 71 years of age and your health care provider tells you that you are at risk for this type of infection. ? Your sexual activity has changed since you were last screened and you are at an increased risk for chlamydia or gonorrhea. Ask your health care provider if you are at risk.  If you do not have HIV, but are at risk, it may be recommended that you take a prescription medicine daily to prevent HIV infection. This is called pre-exposure prophylaxis (PrEP). You are considered at risk if: ? You are sexually active and do not regularly use condoms or know the HIV status of your partner(s). ? You take drugs by injection. ? You are sexually active with a partner who has HIV.  Talk with your health care provider about whether you are at high risk  of being infected with HIV. If you choose to begin PrEP, you should first be tested for HIV. You should then be tested every 3 months for as long as you are taking PrEP. Pregnancy  If you are premenopausal and you may become pregnant, ask your health care provider about preconception counseling.  If you may become pregnant, take 400 to 800 micrograms (mcg) of folic acid every day.  If you want to prevent pregnancy, talk to your health care provider about birth control (contraception). Osteoporosis and menopause  Osteoporosis is a disease in which the bones lose minerals and strength with aging. This can result in serious bone fractures. Your risk for osteoporosis can be identified using a bone density scan.  If you are 59 years of age or older, or if you are at risk for osteoporosis and fractures, ask your health care provider if you should be screened.  Ask your health care provider whether you should take a calcium or vitamin D supplement to lower your risk for osteoporosis.  Menopause may have certain physical symptoms and risks.  Hormone replacement therapy may reduce some of these symptoms and risks. Talk to your health care provider about whether hormone replacement therapy is right for you. Follow these instructions at home:  Schedule regular health, dental, and eye exams.  Stay current with your immunizations.  Do not use any tobacco products including cigarettes, chewing tobacco, or electronic cigarettes.  If you are pregnant, do not drink alcohol.  If you are breastfeeding, limit how much and how often you drink alcohol.  Limit alcohol intake to no more than 1 drink per day for nonpregnant women. One drink equals 12 ounces of beer, 5 ounces of wine, or 1 ounces of hard liquor.  Do not use street drugs.  Do not share needles.  Ask your health care provider for help if you need support or information about quitting drugs.  Tell your health care provider if you often  feel depressed.  Tell your health care provider if you have ever been abused or do not feel safe at home. This information is not intended to replace advice given to you by your health care provider. Make sure you discuss any questions you have with your health care provider. Document Released: 08/24/2010 Document Revised: 07/17/2015 Document Reviewed: 11/12/2014 Elsevier Interactive Patient Education  2018 [place]. Higinio Grow M.D.

## 2017-07-22 ENCOUNTER — Encounter: Payer: Self-pay | Admitting: Internal Medicine

## 2017-07-22 ENCOUNTER — Ambulatory Visit (INDEPENDENT_AMBULATORY_CARE_PROVIDER_SITE_OTHER): Payer: PPO | Admitting: Internal Medicine

## 2017-07-22 VITALS — BP 110/60 | HR 65 | Temp 98.0°F | Ht 62.5 in | Wt 137.4 lb

## 2017-07-22 DIAGNOSIS — Z79899 Other long term (current) drug therapy: Secondary | ICD-10-CM | POA: Diagnosis not present

## 2017-07-22 DIAGNOSIS — R945 Abnormal results of liver function studies: Secondary | ICD-10-CM

## 2017-07-22 DIAGNOSIS — R7301 Impaired fasting glucose: Secondary | ICD-10-CM | POA: Diagnosis not present

## 2017-07-22 DIAGNOSIS — Z0000 Encounter for general adult medical examination without abnormal findings: Secondary | ICD-10-CM | POA: Diagnosis not present

## 2017-07-22 DIAGNOSIS — N200 Calculus of kidney: Secondary | ICD-10-CM

## 2017-07-22 DIAGNOSIS — E7800 Pure hypercholesterolemia, unspecified: Secondary | ICD-10-CM | POA: Diagnosis not present

## 2017-07-22 DIAGNOSIS — R7989 Other specified abnormal findings of blood chemistry: Secondary | ICD-10-CM

## 2017-07-22 LAB — CBC WITH DIFFERENTIAL/PLATELET
BASOS ABS: 0 10*3/uL (ref 0.0–0.1)
Basophils Relative: 0.7 % (ref 0.0–3.0)
EOS ABS: 0.1 10*3/uL (ref 0.0–0.7)
Eosinophils Relative: 1.6 % (ref 0.0–5.0)
HEMATOCRIT: 44.3 % (ref 36.0–46.0)
Hemoglobin: 15 g/dL (ref 12.0–15.0)
LYMPHS PCT: 31.1 % (ref 12.0–46.0)
Lymphs Abs: 1.2 10*3/uL (ref 0.7–4.0)
MCHC: 33.7 g/dL (ref 30.0–36.0)
MCV: 90.9 fl (ref 78.0–100.0)
Monocytes Absolute: 0.4 10*3/uL (ref 0.1–1.0)
Monocytes Relative: 9.2 % (ref 3.0–12.0)
NEUTROS ABS: 2.2 10*3/uL (ref 1.4–7.7)
Neutrophils Relative %: 57.4 % (ref 43.0–77.0)
PLATELETS: 224 10*3/uL (ref 150.0–400.0)
RBC: 4.88 Mil/uL (ref 3.87–5.11)
RDW: 14 % (ref 11.5–15.5)
WBC: 3.9 10*3/uL — AB (ref 4.0–10.5)

## 2017-07-22 LAB — HEPATIC FUNCTION PANEL
ALBUMIN: 4.4 g/dL (ref 3.5–5.2)
ALK PHOS: 92 U/L (ref 39–117)
ALT: 39 U/L — ABNORMAL HIGH (ref 0–35)
AST: 24 U/L (ref 0–37)
Bilirubin, Direct: 0.2 mg/dL (ref 0.0–0.3)
Total Bilirubin: 0.7 mg/dL (ref 0.2–1.2)
Total Protein: 7.1 g/dL (ref 6.0–8.3)

## 2017-07-22 LAB — BASIC METABOLIC PANEL
BUN: 17 mg/dL (ref 6–23)
CALCIUM: 9.4 mg/dL (ref 8.4–10.5)
CO2: 28 mEq/L (ref 19–32)
CREATININE: 0.65 mg/dL (ref 0.40–1.20)
Chloride: 105 mEq/L (ref 96–112)
GFR: 95.13 mL/min (ref >60.00)
GLUCOSE: 95 mg/dL (ref 70–99)
Potassium: 4.1 mEq/L (ref 3.5–5.1)
Sodium: 141 mEq/L (ref 135–145)

## 2017-07-22 LAB — LIPID PANEL
Cholesterol: 188 mg/dL (ref 0–200)
HDL: 64 mg/dL (ref >39.00)
LDL Cholesterol: 112 mg/dL — ABNORMAL HIGH (ref 0–99)
NONHDL: 123.79
Total CHOL/HDL Ratio: 3
Triglycerides: 58 mg/dL (ref 0.0–149.0)
VLDL: 11.6 mg/dL (ref 0.0–40.0)

## 2017-07-22 LAB — HEMOGLOBIN A1C: HEMOGLOBIN A1C: 5.6 % (ref 4.6–6.5)

## 2017-07-22 LAB — TSH: TSH: 2.18 u[IU]/mL (ref 0.35–4.50)

## 2017-07-22 NOTE — Patient Instructions (Signed)
Glad you are doing well.  Continue lifestyle intervention healthy eating and exercise .   Exam next year    Due for td fall 2019  Discussed  Next  Screen when due for colon screen in 2022   Health Maintenance, Female Adopting a healthy lifestyle and getting preventive care can go a long way to promote health and wellness. Talk with your health care provider about what schedule of regular examinations is right for you. This is a good chance for you to check in with your provider about disease prevention and staying healthy. In between checkups, there are plenty of things you can do on your own. Experts have done a lot of research about which lifestyle changes and preventive measures are most likely to keep you healthy. Ask your health care provider for more information. Weight and diet Eat a healthy diet  Be sure to include plenty of vegetables, fruits, low-fat dairy products, and lean protein.  Do not eat a lot of foods high in solid fats, added sugars, or salt.  Get regular exercise. This is one of the most important things you can do for your health. ? Most adults should exercise for at least 150 minutes each week. The exercise should increase your heart rate and make you sweat (moderate-intensity exercise). ? Most adults should also do strengthening exercises at least twice a week. This is in addition to the moderate-intensity exercise.  Maintain a healthy weight  Body mass index (BMI) is a measurement that can be used to identify possible weight problems. It estimates body fat based on height and weight. Your health care provider can help determine your BMI and help you achieve or maintain a healthy weight.  For females 28 years of age and older: ? A BMI below 18.5 is considered underweight. ? A BMI of 18.5 to 24.9 is normal. ? A BMI of 25 to 29.9 is considered overweight. ? A BMI of 30 and above is considered obese.  Watch levels of cholesterol and blood lipids  You should  start having your blood tested for lipids and cholesterol at 72 years of age, then have this test every 5 years.  You may need to have your cholesterol levels checked more often if: ? Your lipid or cholesterol levels are high. ? You are older than 72 years of age. ? You are at high risk for heart disease.  Cancer screening Lung Cancer  Lung cancer screening is recommended for adults 74-77 years old who are at high risk for lung cancer because of a history of smoking.  A yearly low-dose CT scan of the lungs is recommended for people who: ? Currently smoke. ? Have quit within the past 15 years. ? Have at least a 30-pack-year history of smoking. A pack year is smoking an average of one pack of cigarettes a day for 1 year.  Yearly screening should continue until it has been 15 years since you quit.  Yearly screening should stop if you develop a health problem that would prevent you from having lung cancer treatment.  Breast Cancer  Practice breast self-awareness. This means understanding how your breasts normally appear and feel.  It also means doing regular breast self-exams. Let your health care provider know about any changes, no matter how small.  If you are in your 20s or 30s, you should have a clinical breast exam (CBE) by a health care provider every 1-3 years as part of a regular health exam.  If you are  61 or older, have a CBE every year. Also consider having a breast X-ray (mammogram) every year.  If you have a family history of breast cancer, talk to your health care provider about genetic screening.  If you are at high risk for breast cancer, talk to your health care provider about having an MRI and a mammogram every year.  Breast cancer gene (BRCA) assessment is recommended for women who have family members with BRCA-related cancers. BRCA-related cancers include: ? Breast. ? Ovarian. ? Tubal. ? Peritoneal cancers.  Results of the assessment will determine the need  for genetic counseling and BRCA1 and BRCA2 testing.  Cervical Cancer Your health care provider may recommend that you be screened regularly for cancer of the pelvic organs (ovaries, uterus, and vagina). This screening involves a pelvic examination, including checking for microscopic changes to the surface of your cervix (Pap test). You may be encouraged to have this screening done every 3 years, beginning at age 40.  For women ages 71-65, health care providers may recommend pelvic exams and Pap testing every 3 years, or they may recommend the Pap and pelvic exam, combined with testing for human papilloma virus (HPV), every 5 years. Some types of HPV increase your risk of cervical cancer. Testing for HPV may also be done on women of any age with unclear Pap test results.  Other health care providers may not recommend any screening for nonpregnant women who are considered low risk for pelvic cancer and who do not have symptoms. Ask your health care provider if a screening pelvic exam is right for you.  If you have had past treatment for cervical cancer or a condition that could lead to cancer, you need Pap tests and screening for cancer for at least 20 years after your treatment. If Pap tests have been discontinued, your risk factors (such as having a new sexual partner) need to be reassessed to determine if screening should resume. Some women have medical problems that increase the chance of getting cervical cancer. In these cases, your health care provider may recommend more frequent screening and Pap tests.  Colorectal Cancer  This type of cancer can be detected and often prevented.  Routine colorectal cancer screening usually begins at 72 years of age and continues through 72 years of age.  Your health care provider may recommend screening at an earlier age if you have risk factors for colon cancer.  Your health care provider may also recommend using home test kits to check for hidden blood in  the stool.  A small camera at the end of a tube can be used to examine your colon directly (sigmoidoscopy or colonoscopy). This is done to check for the earliest forms of colorectal cancer.  Routine screening usually begins at age 19.  Direct examination of the colon should be repeated every 5-10 years through 72 years of age. However, you may need to be screened more often if early forms of precancerous polyps or small growths are found.  Skin Cancer  Check your skin from head to toe regularly.  Tell your health care provider about any new moles or changes in moles, especially if there is a change in a mole's shape or color.  Also tell your health care provider if you have a mole that is larger than the size of a pencil eraser.  Always use sunscreen. Apply sunscreen liberally and repeatedly throughout the day.  Protect yourself by wearing long sleeves, pants, a wide-brimmed hat, and sunglasses whenever you  are outside.  Heart disease, diabetes, and high blood pressure  High blood pressure causes heart disease and increases the risk of stroke. High blood pressure is more likely to develop in: ? People who have blood pressure in the high end of the normal range (130-139/85-89 mm Hg). ? People who are overweight or obese. ? People who are African American.  If you are 70-3 years of age, have your blood pressure checked every 3-5 years. If you are 15 years of age or older, have your blood pressure checked every year. You should have your blood pressure measured twice-once when you are at a hospital or clinic, and once when you are not at a hospital or clinic. Record the average of the two measurements. To check your blood pressure when you are not at a hospital or clinic, you can use: ? An automated blood pressure machine at a pharmacy. ? A home blood pressure monitor.  If you are between 40 years and 58 years old, ask your health care provider if you should take aspirin to prevent  strokes.  Have regular diabetes screenings. This involves taking a blood sample to check your fasting blood sugar level. ? If you are at a normal weight and have a low risk for diabetes, have this test once every three years after 72 years of age. ? If you are overweight and have a high risk for diabetes, consider being tested at a younger age or more often. Preventing infection Hepatitis B  If you have a higher risk for hepatitis B, you should be screened for this virus. You are considered at high risk for hepatitis B if: ? You were born in a country where hepatitis B is common. Ask your health care provider which countries are considered high risk. ? Your parents were born in a high-risk country, and you have not been immunized against hepatitis B (hepatitis B vaccine). ? You have HIV or AIDS. ? You use needles to inject street drugs. ? You live with someone who has hepatitis B. ? You have had sex with someone who has hepatitis B. ? You get hemodialysis treatment. ? You take certain medicines for conditions, including cancer, organ transplantation, and autoimmune conditions.  Hepatitis C  Blood testing is recommended for: ? Everyone born from [birth year] through 1965. ? Anyone with known risk factors for hepatitis C.  Sexually transmitted infections (STIs)  You should be screened for sexually transmitted infections (STIs) including gonorrhea and chlamydia if: ? You are sexually active and are younger than 72 years of age. ? You are older than 72 years of age and your health care provider tells you that you are at risk for this type of infection. ? Your sexual activity has changed since you were last screened and you are at an increased risk for chlamydia or gonorrhea. Ask your health care provider if you are at risk.  If you do not have HIV, but are at risk, it may be recommended that you take a prescription medicine daily to prevent HIV infection. This is called pre-exposure prophylaxis  (PrEP). You are considered at risk if: ? You are sexually active and do not regularly use condoms or know the HIV status of your partner(s). ? You take drugs by injection. ? You are sexually active with a partner who has HIV.  Talk with your health care provider about whether you are at high risk of being infected with HIV. If you choose to begin PrEP, you should first be  tested for HIV. You should then be tested every 3 months for as long as you are taking PrEP. Pregnancy  If you are premenopausal and you may become pregnant, ask your health care provider about preconception counseling.  If you may become pregnant, take 400 to 800 micrograms (mcg) of folic acid every day.  If you want to prevent pregnancy, talk to your health care provider about birth control (contraception). Osteoporosis and menopause  Osteoporosis is a disease in which the bones lose minerals and strength with aging. This can result in serious bone fractures. Your risk for osteoporosis can be identified using a bone density scan.  If you are 41 years of age or older, or if you are at risk for osteoporosis and fractures, ask your health care provider if you should be screened.  Ask your health care provider whether you should take a calcium or vitamin D supplement to lower your risk for osteoporosis.  Menopause may have certain physical symptoms and risks.  Hormone replacement therapy may reduce some of these symptoms and risks. Talk to your health care provider about whether hormone replacement therapy is right for you. Follow these instructions at home:  Schedule regular health, dental, and eye exams.  Stay current with your immunizations.  Do not use any tobacco products including cigarettes, chewing tobacco, or electronic cigarettes.  If you are pregnant, do not drink alcohol.  If you are breastfeeding, limit how much and how often you drink alcohol.  Limit alcohol intake to no more than 1 drink per day for  nonpregnant women. One drink equals 12 ounces of beer, 5 ounces of wine, or 1 ounces of hard liquor.  Do not use street drugs.  Do not share needles.  Ask your health care provider for help if you need support or information about quitting drugs.  Tell your health care provider if you often feel depressed.  Tell your health care provider if you have ever been abused or do not feel safe at home. This information is not intended to replace advice given to you by your health care provider. Make sure you discuss any questions you have with your health care provider. Document Released: 08/24/2010 Document Revised: 07/17/2015 Document Reviewed: 11/12/2014 Elsevier Interactive Patient Education  [employer].

## 2017-07-25 NOTE — Telephone Encounter (Signed)
Duplicate message See result notes.  Lab orders placed.  Pt notified. Nothing further needed.

## 2017-07-26 ENCOUNTER — Encounter: Payer: PPO | Admitting: Internal Medicine

## 2017-07-29 ENCOUNTER — Other Ambulatory Visit (INDEPENDENT_AMBULATORY_CARE_PROVIDER_SITE_OTHER): Payer: PPO

## 2017-07-29 DIAGNOSIS — R945 Abnormal results of liver function studies: Secondary | ICD-10-CM

## 2017-07-29 DIAGNOSIS — R7989 Other specified abnormal findings of blood chemistry: Secondary | ICD-10-CM

## 2017-07-30 LAB — HEPATITIS B SURFACE ANTIBODY,QUALITATIVE: HEP B S AB: NONREACTIVE

## 2017-07-30 LAB — HEPATITIS C ANTIBODY
Hepatitis C Ab: NONREACTIVE
SIGNAL TO CUT-OFF: 0.01 (ref <1.00)

## 2017-07-30 LAB — HEPATITIS B SURFACE ANTIGEN: HEP B S AG: NONREACTIVE

## 2017-08-08 ENCOUNTER — Telehealth: Payer: Self-pay | Admitting: Internal Medicine

## 2017-08-08 NOTE — Telephone Encounter (Signed)
Copied from [place] [id number]. Topic: Inquiry >> Aug 08, 2017  7:41 AM Pricilla Handler wrote: Reason for CRM: Patient called for her lab results.       Thank You!!!

## 2017-08-08 NOTE — Telephone Encounter (Signed)
I returned her call and gave her her lab results ordered by Dr. Regis Bill on 07/29/17.  I made a lab appt for 10/10/17 at 10:30am  At her request for abnormal LFTs.   I let her know Hepatitis serology is negative.  No questions from pt.

## 2017-10-10 ENCOUNTER — Other Ambulatory Visit: Payer: PPO

## 2017-10-11 ENCOUNTER — Encounter: Payer: Self-pay | Admitting: Internal Medicine

## 2017-11-09 ENCOUNTER — Other Ambulatory Visit (INDEPENDENT_AMBULATORY_CARE_PROVIDER_SITE_OTHER): Payer: PPO

## 2017-11-09 DIAGNOSIS — R945 Abnormal results of liver function studies: Secondary | ICD-10-CM | POA: Diagnosis not present

## 2017-11-09 DIAGNOSIS — R7989 Other specified abnormal findings of blood chemistry: Secondary | ICD-10-CM

## 2017-11-09 LAB — HEPATIC FUNCTION PANEL
ALBUMIN: 4 g/dL (ref 3.5–5.2)
ALK PHOS: 81 U/L (ref 39–117)
ALT: 21 U/L (ref 0–35)
AST: 17 U/L (ref 0–37)
Bilirubin, Direct: 0.1 mg/dL (ref 0.0–0.3)
TOTAL PROTEIN: 6.6 g/dL (ref 6.0–8.3)
Total Bilirubin: 0.6 mg/dL (ref 0.2–1.2)

## 2017-11-29 DIAGNOSIS — H04221 Epiphora due to insufficient drainage, right lacrimal gland: Secondary | ICD-10-CM | POA: Diagnosis not present

## 2017-12-22 ENCOUNTER — Other Ambulatory Visit: Payer: Self-pay | Admitting: Internal Medicine

## 2017-12-22 DIAGNOSIS — Z1231 Encounter for screening mammogram for malignant neoplasm of breast: Secondary | ICD-10-CM

## 2018-01-31 ENCOUNTER — Ambulatory Visit
Admission: RE | Admit: 2018-01-31 | Discharge: 2018-01-31 | Disposition: A | Discharge status: Home / Self Care | Payer: PPO | Source: Ambulatory Visit | Attending: Internal Medicine | Admitting: Internal Medicine

## 2018-01-31 DIAGNOSIS — Z1231 Encounter for screening mammogram for malignant neoplasm of breast: Secondary | ICD-10-CM

## 2018-02-01 DIAGNOSIS — D2272 Melanocytic nevi of left lower limb, including hip: Secondary | ICD-10-CM | POA: Diagnosis not present

## 2018-02-01 DIAGNOSIS — D225 Melanocytic nevi of trunk: Secondary | ICD-10-CM | POA: Diagnosis not present

## 2018-02-01 DIAGNOSIS — Z23 Encounter for immunization: Secondary | ICD-10-CM | POA: Diagnosis not present

## 2018-02-01 DIAGNOSIS — D2261 Melanocytic nevi of right upper limb, including shoulder: Secondary | ICD-10-CM | POA: Diagnosis not present

## 2018-02-01 DIAGNOSIS — Z411 Encounter for cosmetic surgery: Secondary | ICD-10-CM | POA: Diagnosis not present

## 2018-02-01 DIAGNOSIS — Z86018 Personal history of other benign neoplasm: Secondary | ICD-10-CM | POA: Diagnosis not present

## 2018-03-13 DIAGNOSIS — J3089 Other allergic rhinitis: Secondary | ICD-10-CM | POA: Diagnosis not present

## 2018-07-26 ENCOUNTER — Encounter: Payer: PPO | Admitting: Internal Medicine

## 2018-08-01 ENCOUNTER — Encounter: Payer: PPO | Admitting: Internal Medicine

## 2018-09-19 NOTE — Progress Notes (Signed)
Chief Complaint  Patient presents with  . Annual Exam    no concerns    HPI: Cynthia Miller 73 y.o. comes in today for Preventive Medicare exam .Since last visit. doing fairly well.  Mold allergies     Takes meds    Head allergies  Didn't take t his am  No more renal stones.  UTD on mammo  Last dexa 2017  Walking every day weather permitting .  No unusual infections    Health Maintenance  Topic Date Due  . TETANUS/TDAP  11/06/2017  . INFLUENZA VACCINE  09/23/2018  . MAMMOGRAM  02/01/2020  . COLONOSCOPY  07/22/2020  . DEXA SCAN  Completed  . Hepatitis C Screening  Completed  . PNA vac Low Risk Adult  Completed   Health Maintenance Review LIFESTYLE:  Exercise:   Walks 7 miles   Many  Exercise  Stretching yoga   Stopped . 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 Last eye check UTD mccuen   Safety:  Has smoke detector and wears seat belts.  . No excess sun exposure. Sees dentist regularly.  Falls: no  Memory: Felt to be good  , no concern from her or her family.  Depression: No anhedonia unusual crying or depressive symptoms  Nutrition: Eats well balanced diet; adequate calcium and vitamin D. No swallowing chewing problems.  Injury: no major injuries in the last six months.  Other healthcare providers:  Reviewed today .Marland Deskin   Preventive parameters: up-to-date  Reviewed   ADLS:   There are no problems or need for assistance  driving, feeding, obtaining food, dressing, toileting and bathing, managing money using phone. She is independent.    ROS:  GEN/ HEENT: No fever, significant weight changes sweats headaches vision problems hearing changes, CV/ PULM; No chest pain shortness of breath cough, syncope,edema  change in exercise tolerance. GI /GU: No adominal pain, vomiting, change in bowel habits. No blood  in the stool. No significant GU symptoms. SKIN/HEME: ,no acute skin rashes suspicious lesions or bleeding. No lymphadenopathy, nodules, masses.  NEURO/ PSYCH:  No neurologic signs such as weakness numbness. No depression anxiety. IMM/ Allergy: No unusual infections.  Allergy .   REST of 12 system review negative except as per HPI   Past Medical History:  Diagnosis Date  . ALLERGIC RHINITIS   . CIN I (cervical intraepithelial neoplasia I)   . FIBROCYSTIC BREAST DISEASE   . Hx of varicella   . Kidney stones 07/08/2016  . Low blood pressure   . Osteoarthritis    THUMB  . Osteopenia   . Tinnitus of both ears    L>R, follows with ENT for same    Family History  Problem Relation Age of Onset  . Diverticulosis Father   . Lung cancer Father        brain mets,did smoke  . Hyperlipidemia Mother        died age 26  . Thyroid disease Mother   . Breast cancer Paternal Grandmother   . Healthy Brother   . Colon cancer Neg Hx     Social History   Socioeconomic History  . Marital status: Married    Spouse name: Not on file  . Number of children: 1  .  Years of education: 51  . Highest education level: Not on file  Occupational History  . Occupation: Environmental education officer: RETIRED  Social Needs  . Financial resource strain: Not on file  . Food insecurity    Worry: Not on file    Inability: Not on file  . Transportation needs    Medical: Not on file    Non-medical: Not on file  Tobacco Use  . Smoking status: Never Smoker  . Smokeless tobacco: Never Used  Substance and Sexual Activity  . Alcohol use: Yes    Alcohol/week: 14.0 standard drinks    Types: 14 Glasses of wine per week  . Drug use: No  . Sexual activity: Yes    Partners: Male    Birth control/protection: Surgical  Lifestyle  . Physical activity    Days per week: Not on file    Minutes per session: Not on file  . Stress: Not on file  Relationships  . Social Herbalist on phone: Not on file     Gets together: Not on file    Attends religious service: Not on file    Active member of club or organization: Not on file    Attends meetings of clubs or organizations: Not on file    Relationship status: Not on file  Other Topics Concern  . Not on file  Social History Narrative   HS[facility], [employer] in [place] to finish BA Art history. Married 1969 - 10 yr/divorce. Remarried '83 - 10 yrs/divorced. Remarried '97 -. 1 dtr - '74. 2 grandchildren. Work - volunteering. Marriage in good health.     Outpatient Encounter Medications as of 09/20/2018  Medication Sig  . levocetirizine (XYZAL) 5 MG tablet Take 1 tablet (5 mg total) by mouth every evening.  . montelukast (SINGULAIR) 10 MG tablet Take 1 tablet (10 mg total) by mouth at bedtime.  . tamsulosin (FLOMAX) 0.4 MG CAPS capsule Take 1 capsule (0.4 mg total) by mouth daily.   No facility-administered encounter medications on file as of 09/20/2018.     EXAM:  BP 120/68 (BP Location: Right Arm, Patient Position: Sitting, Cuff Size: Normal)   Pulse (!) 104   Temp 98.2 F (36.8 C) (Oral)   Ht 5' 2.5" (1.588 m)   Wt 139 lb 12.8 oz (63.4 kg)   SpO2 99%   BMI 25.16 kg/m   Body mass index is 25.16 kg/m.  Physical Exam: Vital signs reviewed NGE:XBMW is a well-developed well-nourished alert cooperative   who appears stated age in no acute distress.  HEENT: normocephalic atraumatic , Eyes: PERRL EOM's full, conjunctiva clear, Nares: paten,t no deformity discharge or tenderness., Ears: no deformity EAC's clear TMs with normal landmarks. Mouth: masked NECK: supple without masses, thyromegaly or bruits. CHEST/PULM:  Clear to auscultation and percussion breath sounds equal no wheeze , rales or rhonchi. No chest wall deformities or tenderness. Breast: normal by inspection . No dimpling, discharge, masses, tenderness or discharge . CV: PMI is nondisplaced, S1 S2 no gallops, murmurs, rubs. Peripheral pulses are full without  delay.No JVD .  ABDOMEN: Bowel sounds normal nontender  No guard or rebound, no hepato splenomegal no CVA tenderness.   Extremtities:  No clubbing cyanosis or edema, no acute joint swelling or redness no focal atrophy NEURO:  Oriented x3, cranial nerves 3-12 appear to be intact, no obvious focal weakness,gait within normal limits  SKIN: No acute rashes normal turgor, color, no bruising or petechiae. PSYCH:  Oriented, good eye contact, no obvious depression anxiety, cognition and judgment appear normal. LN: no cervical axillary inguinal adenopathy No noted deficits in memory, attention, and speech.   Lab Results  Component Value Date   WBC 3.9 (L) 07/22/2017   HGB 15.0 07/22/2017   HCT 44.3 07/22/2017   PLT 224.0 07/22/2017   GLUCOSE 95 07/22/2017   CHOL 188 07/22/2017   TRIG 58.0 07/22/2017   HDL 64.00 07/22/2017   LDLCALC 112 (H) 07/22/2017   ALT 21 11/09/2017   AST 17 11/09/2017   NA 141 07/22/2017   K 4.1 07/22/2017   CL 105 07/22/2017   CREATININE 0.65 07/22/2017   BUN 17 07/22/2017   CO2 28 07/22/2017   TSH 2.18 07/22/2017   HGBA1C 5.6 07/22/2017    ASSESSMENT AND PLAN:  Discussed the following assessment and plan:   ICD-10-CM   1. Visit for preventive health examination  Z00.00   2. Elevated LDL cholesterol level  J67.34 Basic metabolic panel    CBC with Differential/Platelet    Hepatic function panel    Lipid panel    TSH  3. Medication management  L93.790 Basic metabolic panel    CBC with Differential/Platelet    Hepatic function panel    Lipid panel    TSH  4. Leukopenia, unspecified type  W40.973 Basic metabolic panel    CBC with Differential/Platelet    Hepatic function panel    Lipid panel    TSH  5. Environmental allergies  Z91.09   Exam is good  Allergy  Ur  Leukopenia prob clinically insignificant.   Will follow last dexa osteopenia  Ask [place] for order when  Close to getting  Mammogram at breast center and can send in order  utd parameters   Get  Td  at   pharmacy or if injured  Patient Care Team: Marylynne Keelin, Standley Brooking, MD as PCP - General (Internal Medicine) Irene Shipper, MD (Gastroenterology) Jari Pigg, MD (Dermatology) Stefanie Libel, MD (Sports Medicine) Mosetta Anis, MD (Allergy) Leta Baptist, MD (Otolaryngology) Luberta Mutter, MD as Consulting Physician (Ophthalmology)  Patient Instructions  Glad you are doing well .    Health Maintenance, Female Adopting a healthy lifestyle and getting preventive care are important in promoting health and wellness. Ask your health care provider about:  The right schedule for you to have regular tests and exams.  Things you can do on your own to prevent diseases and keep yourself healthy. What should I know about diet, weight, and exercise? Eat a healthy diet   Eat a diet that includes plenty of vegetables, fruits, low-fat dairy products, and lean protein.  Do not eat a lot of foods that are high in solid fats, added sugars, or sodium. Maintain a healthy weight Body mass index (BMI) is used to identify weight problems. It estimates body fat based on height and weight. Your health care provider can help determine your BMI and help you achieve or maintain a healthy weight. Get regular exercise Get regular exercise. This is one of the most important things you can do for your health. Most adults should:  Exercise for at least 150 minutes each week. The exercise should increase your heart rate and make you sweat (moderate-intensity exercise).  Do strengthening exercises at least twice a week. This is in addition to the moderate-intensity exercise.  Spend less time sitting. Even light physical activity can be beneficial. Watch cholesterol and blood lipids Have your blood tested for lipids and cholesterol at 73  years of age, then have this test every 5 years. Have your cholesterol levels checked more often if:  Your lipid or cholesterol levels are high.  You are older than 73 years of  age.  You are at high risk for heart disease. What should I know about cancer screening? Depending on your health history and family history, you may need to have cancer screening at various ages. This may include screening for:  Breast cancer.  Cervical cancer.  Colorectal cancer.  Skin cancer.  Lung cancer. What should I know about heart disease, diabetes, and high blood pressure? Blood pressure and heart disease  High blood pressure causes heart disease and increases the risk of stroke. This is more likely to develop in people who have high blood pressure readings, are of African descent, or are overweight.  Have your blood pressure checked: ? Every 3-5 years if you are 10-88 years of age. ? Every year if you are 52 years old or older. Diabetes Have regular diabetes screenings. This checks your fasting blood sugar level. Have the screening done:  Once every three years after age 28 if you are at a normal weight and have a low risk for diabetes.  More often and at a younger age if you are overweight or have a high risk for diabetes. What should I know about preventing infection? Hepatitis B If you have a higher risk for hepatitis B, you should be screened for this virus. Talk with your health care provider to find out if you are at risk for hepatitis B infection. Hepatitis C Testing is recommended for:  Everyone born from [birth year] through 1965.  Anyone with known risk factors for hepatitis C. Sexually transmitted infections (STIs)  Get screened for STIs, including gonorrhea and chlamydia, if: ? You are sexually active and are younger than 73 years of age. ? You are older than 73 years of age and your health care provider tells you that you are at risk for this type of infection. ? Your sexual activity has changed since you were last screened, and you are at increased risk for chlamydia or gonorrhea. Ask your health care provider if you are at risk.  Ask your health care  provider about whether you are at high risk for HIV. Your health care provider may recommend a prescription medicine to help prevent HIV infection. If you choose to take medicine to prevent HIV, you should first get tested for HIV. You should then be tested every 3 months for as long as you are taking the medicine. Pregnancy  If you are about to stop having your period (premenopausal) and you may become pregnant, seek counseling before you get pregnant.  Take 400 to 800 micrograms (mcg) of folic acid every day if you become pregnant.  Ask for birth control (contraception) if you want to prevent pregnancy. Osteoporosis and menopause Osteoporosis is a disease in which the bones lose minerals and strength with aging. This can result in bone fractures. If you are 17 years old or older, or if you are at risk for osteoporosis and fractures, ask your health care provider if you should:  Be screened for bone loss.  Take a calcium or vitamin D supplement to lower your risk of fractures.  Be given hormone replacement therapy (HRT) to treat symptoms of menopause. Follow these instructions at home: Lifestyle  Do not use any products that contain nicotine or tobacco, such as cigarettes, e-cigarettes, and chewing tobacco. If you need help quitting, ask  your health care provider.  Do not use street drugs.  Do not share needles.  Ask your health care provider for help if you need support or information about quitting drugs. Alcohol use  Do not drink alcohol if: ? Your health care provider tells you not to drink. ? You are pregnant, may be pregnant, or are planning to become pregnant.  If you drink alcohol: ? Limit how much you use to 0-1 drink a day. ? Limit intake if you are breastfeeding.  Be aware of how much alcohol is in your drink. In the [place] one drink equals one 12 oz bottle of beer (355 mL), one 5 oz glass of wine (148 mL), or one 1 oz glass of hard liquor (44 mL). General  instructions  Schedule regular health, dental, and eye exams.  Stay current with your vaccines.  Tell your health care provider if: ? You often feel depressed. ? You have ever been abused or do not feel safe at home. Summary  Adopting a healthy lifestyle and getting preventive care are important in promoting health and wellness.  Follow your health care provider's instructions about healthy diet, exercising, and getting tested or screened for diseases.  Follow your health care provider's instructions on monitoring your cholesterol and blood pressure. This information is not intended to replace advice given to you by your health care provider. Make sure you discuss any questions you have with your health care provider. Document Released: 08/24/2010 Document Revised: 02/01/2018 Document Reviewed: 02/01/2018 Elsevier Patient Education  2020 [place] Maymie Brunke M.D.

## 2018-09-20 ENCOUNTER — Ambulatory Visit (INDEPENDENT_AMBULATORY_CARE_PROVIDER_SITE_OTHER): Payer: PPO | Admitting: Internal Medicine

## 2018-09-20 ENCOUNTER — Other Ambulatory Visit: Payer: Self-pay

## 2018-09-20 ENCOUNTER — Encounter: Payer: Self-pay | Admitting: Internal Medicine

## 2018-09-20 VITALS — BP 120/68 | HR 104 | Temp 98.2°F | Ht 62.5 in | Wt 139.8 lb

## 2018-09-20 DIAGNOSIS — Z9109 Other allergy status, other than to drugs and biological substances: Secondary | ICD-10-CM

## 2018-09-20 DIAGNOSIS — D72819 Decreased white blood cell count, unspecified: Secondary | ICD-10-CM

## 2018-09-20 DIAGNOSIS — E7800 Pure hypercholesterolemia, unspecified: Secondary | ICD-10-CM | POA: Diagnosis not present

## 2018-09-20 DIAGNOSIS — Z79899 Other long term (current) drug therapy: Secondary | ICD-10-CM | POA: Diagnosis not present

## 2018-09-20 DIAGNOSIS — Z0000 Encounter for general adult medical examination without abnormal findings: Secondary | ICD-10-CM

## 2018-09-20 LAB — CBC WITH DIFFERENTIAL/PLATELET
Basophils Absolute: 0 10*3/uL (ref 0.0–0.1)
Basophils Relative: 0.4 % (ref 0.0–3.0)
Eosinophils Absolute: 0 10*3/uL (ref 0.0–0.7)
Eosinophils Relative: 0.7 % (ref 0.0–5.0)
HCT: 44 % (ref 36.0–46.0)
Hemoglobin: 14.6 g/dL (ref 12.0–15.0)
Lymphocytes Relative: 30.5 % (ref 12.0–46.0)
Lymphs Abs: 1.5 10*3/uL (ref 0.7–4.0)
MCHC: 33.3 g/dL (ref 30.0–36.0)
MCV: 94 fl (ref 78.0–100.0)
Monocytes Absolute: 0.5 10*3/uL (ref 0.1–1.0)
Monocytes Relative: 9 % (ref 3.0–12.0)
Neutro Abs: 3 10*3/uL (ref 1.4–7.7)
Neutrophils Relative %: 59.4 % (ref 43.0–77.0)
Platelets: 240 10*3/uL (ref 150.0–400.0)
RBC: 4.68 Mil/uL (ref 3.87–5.11)
RDW: 13.7 % (ref 11.5–15.5)
WBC: 5.1 10*3/uL (ref 4.0–10.5)

## 2018-09-20 LAB — LIPID PANEL
Cholesterol: 186 mg/dL (ref 0–200)
HDL: 65.3 mg/dL (ref >39.00)
LDL Cholesterol: 110 mg/dL — ABNORMAL HIGH (ref 0–99)
NonHDL: 120.56
Total CHOL/HDL Ratio: 3
Triglycerides: 52 mg/dL (ref 0.0–149.0)
VLDL: 10.4 mg/dL (ref 0.0–40.0)

## 2018-09-20 LAB — BASIC METABOLIC PANEL
BUN: 17 mg/dL (ref 6–23)
CO2: 30 mEq/L (ref 19–32)
Calcium: 9.4 mg/dL (ref 8.4–10.5)
Chloride: 104 mEq/L (ref 96–112)
Creatinine, Ser: 0.64 mg/dL (ref 0.40–1.20)
GFR: 90.82 mL/min (ref >60.00)
Glucose, Bld: 96 mg/dL (ref 70–99)
Potassium: 4.5 mEq/L (ref 3.5–5.1)
Sodium: 140 mEq/L (ref 135–145)

## 2018-09-20 LAB — HEPATIC FUNCTION PANEL
ALT: 25 U/L (ref 0–35)
AST: 20 U/L (ref 0–37)
Albumin: 4.4 g/dL (ref 3.5–5.2)
Alkaline Phosphatase: 86 U/L (ref 39–117)
Bilirubin, Direct: 0.1 mg/dL (ref 0.0–0.3)
Total Bilirubin: 0.6 mg/dL (ref 0.2–1.2)
Total Protein: 7 g/dL (ref 6.0–8.3)

## 2018-09-20 LAB — TSH: TSH: 1.39 u[IU]/mL (ref 0.35–4.50)

## 2018-09-20 NOTE — Patient Instructions (Signed)
Glad you are doing well .    Health Maintenance, Female Adopting a healthy lifestyle and getting preventive care are important in promoting health and wellness. Ask your health care provider about:  The right schedule for you to have regular tests and exams.  Things you can do on your own to prevent diseases and keep yourself healthy. What should I know about diet, weight, and exercise? Eat a healthy diet   Eat a diet that includes plenty of vegetables, fruits, low-fat dairy products, and lean protein.  Do not eat a lot of foods that are high in solid fats, added sugars, or sodium. Maintain a healthy weight Body mass index (BMI) is used to identify weight problems. It estimates body fat based on height and weight. Your health care provider can help determine your BMI and help you achieve or maintain a healthy weight. Get regular exercise Get regular exercise. This is one of the most important things you can do for your health. Most adults should:  Exercise for at least 150 minutes each week. The exercise should increase your heart rate and make you sweat (moderate-intensity exercise).  Do strengthening exercises at least twice a week. This is in addition to the moderate-intensity exercise.  Spend less time sitting. Even light physical activity can be beneficial. Watch cholesterol and blood lipids Have your blood tested for lipids and cholesterol at 73 years of age, then have this test every 5 years. Have your cholesterol levels checked more often if:  Your lipid or cholesterol levels are high.  You are older than 73 years of age.  You are at high risk for heart disease. What should I know about cancer screening? Depending on your health history and family history, you may need to have cancer screening at various ages. This may include screening for:  Breast cancer.  Cervical cancer.  Colorectal cancer.  Skin cancer.  Lung cancer. What should I know about heart disease,  diabetes, and high blood pressure? Blood pressure and heart disease  High blood pressure causes heart disease and increases the risk of stroke. This is more likely to develop in people who have high blood pressure readings, are of African descent, or are overweight.  Have your blood pressure checked: ? Every 3-5 years if you are 51-47 years of age. ? Every year if you are 63 years old or older. Diabetes Have regular diabetes screenings. This checks your fasting blood sugar level. Have the screening done:  Once every three years after age 78 if you are at a normal weight and have a low risk for diabetes.  More often and at a younger age if you are overweight or have a high risk for diabetes. What should I know about preventing infection? Hepatitis B If you have a higher risk for hepatitis B, you should be screened for this virus. Talk with your health care provider to find out if you are at risk for hepatitis B infection. Hepatitis C Testing is recommended for:  Everyone born from [birth year] through 1965.  Anyone with known risk factors for hepatitis C. Sexually transmitted infections (STIs)  Get screened for STIs, including gonorrhea and chlamydia, if: ? You are sexually active and are younger than 73 years of age. ? You are older than 73 years of age and your health care provider tells you that you are at risk for this type of infection. ? Your sexual activity has changed since you were last screened, and you are at increased risk for  chlamydia or gonorrhea. Ask your health care provider if you are at risk.  Ask your health care provider about whether you are at high risk for HIV. Your health care provider may recommend a prescription medicine to help prevent HIV infection. If you choose to take medicine to prevent HIV, you should first get tested for HIV. You should then be tested every 3 months for as long as you are taking the medicine. Pregnancy  If you are about to stop having your  period (premenopausal) and you may become pregnant, seek counseling before you get pregnant.  Take 400 to 800 micrograms (mcg) of folic acid every day if you become pregnant.  Ask for birth control (contraception) if you want to prevent pregnancy. Osteoporosis and menopause Osteoporosis is a disease in which the bones lose minerals and strength with aging. This can result in bone fractures. If you are 91 years old or older, or if you are at risk for osteoporosis and fractures, ask your health care provider if you should:  Be screened for bone loss.  Take a calcium or vitamin D supplement to lower your risk of fractures.  Be given hormone replacement therapy (HRT) to treat symptoms of menopause. Follow these instructions at home: Lifestyle  Do not use any products that contain nicotine or tobacco, such as cigarettes, e-cigarettes, and chewing tobacco. If you need help quitting, ask your health care provider.  Do not use street drugs.  Do not share needles.  Ask your health care provider for help if you need support or information about quitting drugs. Alcohol use  Do not drink alcohol if: ? Your health care provider tells you not to drink. ? You are pregnant, may be pregnant, or are planning to become pregnant.  If you drink alcohol: ? Limit how much you use to 0-1 drink a day. ? Limit intake if you are breastfeeding.  Be aware of how much alcohol is in your drink. In the [place] one drink equals one 12 oz bottle of beer (355 mL), one 5 oz glass of wine (148 mL), or one 1 oz glass of hard liquor (44 mL). General instructions  Schedule regular health, dental, and eye exams.  Stay current with your vaccines.  Tell your health care provider if: ? You often feel depressed. ? You have ever been abused or do not feel safe at home. Summary  Adopting a healthy lifestyle and getting preventive care are important in promoting health and wellness.  Follow your health care provider's  instructions about healthy diet, exercising, and getting tested or screened for diseases.  Follow your health care provider's instructions on monitoring your cholesterol and blood pressure. This information is not intended to replace advice given to you by your health care provider. Make sure you discuss any questions you have with your health care provider. Document Released: 08/24/2010 Document Revised: 02/01/2018 Document Reviewed: 02/01/2018 Elsevier Patient Education  2020 [employer].

## 2018-10-09 ENCOUNTER — Telehealth: Payer: Self-pay

## 2018-10-09 NOTE — Telephone Encounter (Signed)
Pt has been notified to get this done at pharmacy due to insurance

## 2018-10-09 NOTE — Telephone Encounter (Signed)
Copied from [place] [id number]. Topic: General - Other >> Oct 09, 2018  9:12 AM Cynthia Miller wrote: Patient calling to inquire if she is able to come in the office for a tetanus shot.

## 2018-11-15 ENCOUNTER — Ambulatory Visit (INDEPENDENT_AMBULATORY_CARE_PROVIDER_SITE_OTHER): Payer: PPO

## 2018-11-15 ENCOUNTER — Other Ambulatory Visit: Payer: Self-pay

## 2018-11-15 DIAGNOSIS — Z23 Encounter for immunization: Secondary | ICD-10-CM

## 2019-01-01 ENCOUNTER — Other Ambulatory Visit: Payer: Self-pay | Admitting: Internal Medicine

## 2019-01-01 DIAGNOSIS — Z1231 Encounter for screening mammogram for malignant neoplasm of breast: Secondary | ICD-10-CM

## 2019-02-28 ENCOUNTER — Ambulatory Visit
Admission: RE | Admit: 2019-02-28 | Discharge: 2019-02-28 | Disposition: A | Discharge status: Home / Self Care | Payer: PPO | Source: Ambulatory Visit | Attending: Internal Medicine | Admitting: Internal Medicine

## 2019-02-28 ENCOUNTER — Other Ambulatory Visit: Payer: Self-pay

## 2019-02-28 DIAGNOSIS — Z1231 Encounter for screening mammogram for malignant neoplasm of breast: Secondary | ICD-10-CM

## 2019-03-01 DIAGNOSIS — Z23 Encounter for immunization: Secondary | ICD-10-CM | POA: Diagnosis not present

## 2019-03-01 DIAGNOSIS — Z411 Encounter for cosmetic surgery: Secondary | ICD-10-CM | POA: Diagnosis not present

## 2019-03-01 DIAGNOSIS — L578 Other skin changes due to chronic exposure to nonionizing radiation: Secondary | ICD-10-CM | POA: Diagnosis not present

## 2019-03-01 DIAGNOSIS — D2372 Other benign neoplasm of skin of left lower limb, including hip: Secondary | ICD-10-CM | POA: Diagnosis not present

## 2019-03-01 DIAGNOSIS — D2261 Melanocytic nevi of right upper limb, including shoulder: Secondary | ICD-10-CM | POA: Diagnosis not present

## 2019-03-01 DIAGNOSIS — D225 Melanocytic nevi of trunk: Secondary | ICD-10-CM | POA: Diagnosis not present

## 2019-03-01 DIAGNOSIS — L821 Other seborrheic keratosis: Secondary | ICD-10-CM | POA: Diagnosis not present

## 2019-03-01 DIAGNOSIS — Z86018 Personal history of other benign neoplasm: Secondary | ICD-10-CM | POA: Diagnosis not present

## 2019-03-01 DIAGNOSIS — D2272 Melanocytic nevi of left lower limb, including hip: Secondary | ICD-10-CM | POA: Diagnosis not present

## 2019-03-06 ENCOUNTER — Ambulatory Visit: Payer: PPO | Attending: Internal Medicine

## 2019-03-06 DIAGNOSIS — Z20822 Contact with and (suspected) exposure to covid-19: Secondary | ICD-10-CM

## 2019-03-08 LAB — NOVEL CORONAVIRUS, NAA: SARS-CoV-2, NAA: NOT DETECTED

## 2019-03-11 ENCOUNTER — Ambulatory Visit: Payer: PPO | Attending: Internal Medicine

## 2019-03-11 DIAGNOSIS — Z23 Encounter for immunization: Secondary | ICD-10-CM | POA: Insufficient documentation

## 2019-03-11 NOTE — Progress Notes (Signed)
   Covid-19 Vaccination Clinic  Name:  Cynthia Miller    MRN: [id number] DOB: [birth year]  03/11/2019  [name] was observed post Covid-19 immunization for 15 minutes without incidence. She was provided with Vaccine Information Sheet and instruction to access the V-Safe system.   [name] was instructed to call 911 with any severe reactions post vaccine: Marland Folds Difficulty breathing  . Swelling of your face and throat  . A fast heartbeat  . A bad rash all over your body  . Dizziness and weakness

## 2019-03-14 DIAGNOSIS — J3089 Other allergic rhinitis: Secondary | ICD-10-CM | POA: Diagnosis not present

## 2019-04-01 ENCOUNTER — Ambulatory Visit: Payer: PPO | Attending: Internal Medicine

## 2019-04-01 DIAGNOSIS — Z23 Encounter for immunization: Secondary | ICD-10-CM | POA: Insufficient documentation

## 2019-04-01 NOTE — Progress Notes (Signed)
   Covid-19 Vaccination Clinic  Name:  Cynthia Miller    MRN: [id number] DOB: [birth year]  04/01/2019  [name] was observed post Covid-19 immunization for 15 minutes without incidence. She was provided with Vaccine Information Sheet and instruction to access the V-Safe system.   [name] was instructed to call 911 with any severe reactions post vaccine: Marland Weier Difficulty breathing  . Swelling of your face and throat  . A fast heartbeat  . A bad rash all over your body  . Dizziness and weakness    Immunizations Administered    Name Date Dose VIS Date Route   Pfizer COVID-19 Vaccine 04/01/2019 10:37 AM 0.3 mL 02/02/2019 Intramuscular   Manufacturer: [place]   Lot: [id number]   [place]: [id number]

## 2019-10-08 NOTE — Progress Notes (Signed)
Chief Complaint  Patient presents with  . Annual Exam    doing well    HPI: Cynthia Miller 74 y.o. comes in today for Preventive Medicare exam/ wellness visit .Since last visit.  Allergy  Under control  No new sx  Health Maintenance  Topic Date Due  . INFLUENZA VACCINE  09/23/2019  . COLONOSCOPY  07/22/2020  . MAMMOGRAM  02/27/2021  . TETANUS/TDAP  10/08/2028  . DEXA SCAN  Completed  . COVID-19 Vaccine  Completed  . Hepatitis C Screening  Completed  . PNA vac Low Risk Adult  Completed   Health Maintenance Review LIFESTYLE:  Exercise:  Intense until the heat   Miles per day Tobacco/ETS: no Alcohol:   One   Per day recently  Sugar beverages:  no Sleep:  About 6-7  Drug use: no HH: 2 pet cat.   Hearing:  ok  Vision:  No limitations at present . Last eye check UTD mucuen upcoming   Safety:  Has smoke detector and wears seat belts.  No firearms. No excess sun exposure. Sees dentist regularly.  Falls: Marland Gerke  Memory: Felt to be good  , no concern from her or her family.  Depression: No anhedonia unusual crying or depressive symptoms  Nutrition: Eats well balanced diet; adequate calcium and vitamin D. No swallowing chewing problems.  Injury: no major injuries in the last six months.  Other healthcare providers:  Reviewed today .   Preventive parameters: up-to-date  Reviewed   ADLS:   There are no problems or need for assistance  driving, feeding, obtaining food, dressing, toileting and bathing, managing money using phone. She is independent.   ROS:  GEN/ HEENT: No fever, significant weight changes sweats headaches vision problems hearing changes, CV/ PULM; No chest pain shortness of breath cough, syncope,edema  change in exercise tolerance. GI /GU: No adominal pain, vomiting, change in bowel habits. No blood in the stool. No significant GU symptoms. SKIN/HEME: ,no acute skin rashes suspicious lesions or bleeding. No lymphadenopathy, nodules, masses.  NEURO/ PSYCH:   No neurologic signs such as weakness numbness. No depression anxiety. IMM/ Allergy: No unusual infections.  Allergy .   REST of 12 system review negative except as per HPI   Past Medical History:  Diagnosis Date  . ALLERGIC RHINITIS   . CIN I (cervical intraepithelial neoplasia I)   . FIBROCYSTIC BREAST DISEASE   . Hx of varicella   . Kidney stones 07/08/2016  . Low blood pressure   . Osteoarthritis    THUMB  . Osteopenia   . Tinnitus of both ears    L>R, follows with ENT for same    Family History  Problem Relation Age of Onset  . Diverticulosis Father   . Lung cancer Father        brain mets,did smoke  . Hyperlipidemia Mother        died age 57  . Thyroid disease Mother   . Breast cancer Paternal Grandmother   . Healthy Brother   . Colon cancer Neg Hx     Social History   Socioeconomic History  . Marital status: Married    Spouse name: Not on file  . Number of children: 1  . Years of education: 73  . Highest education level: Not on file  Occupational History  . Occupation: Environmental education officer: RETIRED  Tobacco Use  . Smoking status: Never Smoker  . Smokeless tobacco: Never Used  Vaping Use  . Vaping Use:  Never used  Substance and Sexual Activity  . Alcohol use: Yes    Alcohol/week: 14.0 standard drinks    Types: 14 Glasses of wine per week  . Drug use: No  . Sexual activity: Yes    Partners: Male    Birth control/protection: Surgical  Other Topics Concern  . Not on file  Social History Narrative   [employer], [employer] in [place] to finish BA Art history. Married 1969 - 10 yr/divorce. Remarried '83 - 10 yrs/divorced. Remarried '97 -. 1 dtr - '74. 2 grandchildren. Work - volunteering. Marriage in good health.    Social Determinants of Health   Financial Resource Strain:   . Difficulty of Paying Living Expenses:   Food Insecurity:   . Worried About Charity fundraiser in the Last Year:   . Arboriculturist in the Last Year:     Transportation Needs:   . Film/video editor (Medical):   Marland Wilms Lack of Transportation (Non-Medical):   Physical Activity:   . Days of Exercise per Week:   . Minutes of Exercise per Session:   Stress:   . Feeling of Stress :   Social Connections:   . Frequency of Communication with Friends and Family:   . Frequency of Social Gatherings with Friends and Family:   . Attends Religious Services:   . Active Member of Clubs or Organizations:   . Attends Archivist Meetings:   Marland Pfeffer Marital Status:     Outpatient Encounter Medications as of 10/09/2019  Medication Sig  . levocetirizine (XYZAL) 5 MG tablet Take 1 tablet (5 mg total) by mouth every evening.  . montelukast (SINGULAIR) 10 MG tablet Take 1 tablet (10 mg total) by mouth at bedtime.  . tretinoin (RETIN-A) 0.05 % cream Apply 1 application topically at bedtime.  . tamsulosin (FLOMAX) 0.4 MG CAPS capsule Take 1 capsule (0.4 mg total) by mouth daily. (Patient not taking: Reported on 10/09/2019)   No facility-administered encounter medications on file as of 10/09/2019.    EXAM:  BP 126/74   Pulse 71   Temp 97.8 F (36.6 C) (Oral)   Ht 5' 2.1" (1.577 m)   Wt 138 lb (62.6 kg)   SpO2 97%   BMI 25.16 kg/m   Body mass index is 25.16 kg/m.  Physical Exam: Vital signs reviewed OQH:UTML is a well-developed well-nourished alert cooperative   who appears stated age in no acute distress.  HEENT: normocephalic atraumatic , Eyes: PERRL EOM's full, conjunctiva clear, Nares: paten,t no deformity discharge or tenderness., Ears: no deformity EAC's clear TMs with normal landmarks. Mouth masked NECK: supple without masses, thyromegaly or bruits. CHEST/PULM:  Clear to auscultation and percussion breath sounds equal no wheeze , rales or rhonchi. No chest wall deformities or tenderness. CV: PMI is nondisplaced, S1 S2 no gallops, murmurs, rubs. Peripheral pulses are full without delay.No JVD .  ABDOMEN: Bowel sounds normal nontender  No  guard or rebound, no hepato splenomegal no CVA tenderness.   Extremtities:  No clubbing cyanosis or edema, no acute joint swelling or redness no focal atrophy mild djd changes hand  NEURO:  Oriented x3, cranial nerves 3-12 appear to be intact, no obvious focal weakness,gait within normal limits no abnormal reflexes or asymmetrical SKIN: No acute rashes normal turgor, color, no bruising or petechiae. PSYCH: Oriented, good eye contact, no obvious depression anxiety, cognition and judgment appear normal. LN: no cervical axillary inguinal adenopathy No noted deficits in memory, attention, and speech.  Lab Results  Component Value Date   WBC 5.1 09/20/2018   HGB 14.6 09/20/2018   HCT 44.0 09/20/2018   PLT 240.0 09/20/2018   GLUCOSE 96 09/20/2018   CHOL 186 09/20/2018   TRIG 52.0 09/20/2018   HDL 65.30 09/20/2018   LDLCALC 110 (H) 09/20/2018   ALT 25 09/20/2018   AST 20 09/20/2018   NA 140 09/20/2018   K 4.5 09/20/2018   CL 104 09/20/2018   CREATININE 0.64 09/20/2018   BUN 17 09/20/2018   CO2 30 09/20/2018   TSH 1.39 09/20/2018   HGBA1C 5.6 07/22/2017    ASSESSMENT AND PLAN:  Discussed the following assessment and plan:  Visit for preventive health examination  Estrogen deficiency - Plan: Basic metabolic panel, CBC with Differential/Platelet, Hepatic function panel, Lipid panel, TSH, DG Bone Density, TSH, Lipid panel, Hepatic function panel, CBC with Differential/Platelet, Basic metabolic panel, CANCELED: DG Bone Density  Elevated LDL cholesterol level - Plan: Basic metabolic panel, CBC with Differential/Platelet, Hepatic function panel, Lipid panel, TSH, DG Bone Density, TSH, Lipid panel, Hepatic function panel, CBC with Differential/Platelet, Basic metabolic panel, CANCELED: DG Bone Density  Medication management - Plan: Basic metabolic panel, CBC with Differential/Platelet, Hepatic function panel, Lipid panel, TSH, DG Bone Density, TSH, Lipid panel, Hepatic function panel, CBC  with Differential/Platelet, Basic metabolic panel, CANCELED: DG Bone Density  Environmental allergies - Plan: Basic metabolic panel, CBC with Differential/Platelet, Hepatic function panel, Lipid panel, TSH, DG Bone Density, TSH, Lipid panel, Hepatic function panel, CBC with Differential/Platelet, Basic metabolic panel, CANCELED: DG Bone Density Allergy  Under control  Lab monitoring   utd on covid  dexa ordered  [facility]  Patient Care Team: Burnis Medin, MD as PCP - General (Internal Medicine) Irene Shipper, MD (Gastroenterology) Jari Pigg, MD (Dermatology) Stefanie Libel, MD (Sports Medicine) Mosetta Anis, MD (Allergy) Leta Baptist, MD (Otolaryngology) Luberta Mutter, MD as Consulting Physician (Ophthalmology)  Patient Instructions  Will notify you  of labs when available.  Get appt for bone density [facility] lab    Health Maintenance, Female Adopting a healthy lifestyle and getting preventive care are important in promoting health and wellness. Ask your health care provider about:  The right schedule for you to have regular tests and exams.  Things you can do on your own to prevent diseases and keep yourself healthy. What should I know about diet, weight, and exercise? Eat a healthy diet   Eat a diet that includes plenty of vegetables, fruits, low-fat dairy products, and lean protein.  Do not eat a lot of foods that are high in solid fats, added sugars, or sodium. Maintain a healthy weight Body mass index (BMI) is used to identify weight problems. It estimates body fat based on height and weight. Your health care provider can help determine your BMI and help you achieve or maintain a healthy weight. Get regular exercise Get regular exercise. This is one of the most important things you can do for your health. Most adults should:  Exercise for at least 150 minutes each week. The exercise should increase your heart rate and make you sweat (moderate-intensity exercise).  Do  strengthening exercises at least twice a week. This is in addition to the moderate-intensity exercise.  Spend less time sitting. Even light physical activity can be beneficial. Watch cholesterol and blood lipids Have your blood tested for lipids and cholesterol at 74 years of age, then have this test every 5 years. Have your cholesterol levels checked more often if:  Your lipid or cholesterol levels are high.  You are older than 74 years of age.  You are at high risk for heart disease. What should I know about cancer screening? Depending on your health history and family history, you may need to have cancer screening at various ages. This may include screening for:  Breast cancer.  Cervical cancer.  Colorectal cancer.  Skin cancer.  Lung cancer. What should I know about heart disease, diabetes, and high blood pressure? Blood pressure and heart disease  High blood pressure causes heart disease and increases the risk of stroke. This is more likely to develop in people who have high blood pressure readings, are of African descent, or are overweight.  Have your blood pressure checked: ? Every 3-5 years if you are 66-61 years of age. ? Every year if you are 38 years old or older. Diabetes Have regular diabetes screenings. This checks your fasting blood sugar level. Have the screening done:  Once every three years after age 90 if you are at a normal weight and have a low risk for diabetes.  More often and at a younger age if you are overweight or have a high risk for diabetes. What should I know about preventing infection? Hepatitis B If you have a higher risk for hepatitis B, you should be screened for this virus. Talk with your health care provider to find out if you are at risk for hepatitis B infection. Hepatitis C Testing is recommended for:  Everyone born from [birth year] through 1965.  Anyone with known risk factors for hepatitis C. Sexually transmitted infections  (STIs)  Get screened for STIs, including gonorrhea and chlamydia, if: ? You are sexually active and are younger than 74 years of age. ? You are older than 74 years of age and your health care provider tells you that you are at risk for this type of infection. ? Your sexual activity has changed since you were last screened, and you are at increased risk for chlamydia or gonorrhea. Ask your health care provider if you are at risk.  Ask your health care provider about whether you are at high risk for HIV. Your health care provider may recommend a prescription medicine to help prevent HIV infection. If you choose to take medicine to prevent HIV, you should first get tested for HIV. You should then be tested every 3 months for as long as you are taking the medicine. Pregnancy  If you are about to stop having your period (premenopausal) and you may become pregnant, seek counseling before you get pregnant.  Take 400 to 800 micrograms (mcg) of folic acid every day if you become pregnant.  Ask for birth control (contraception) if you want to prevent pregnancy. Osteoporosis and menopause Osteoporosis is a disease in which the bones lose minerals and strength with aging. This can result in bone fractures. If you are 101 years old or older, or if you are at risk for osteoporosis and fractures, ask your health care provider if you should:  Be screened for bone loss.  Take a calcium or vitamin D supplement to lower your risk of fractures.  Be given hormone replacement therapy (HRT) to treat symptoms of menopause. Follow these instructions at home: Lifestyle  Do not use any products that contain nicotine or tobacco, such as cigarettes, e-cigarettes, and chewing tobacco. If you need help quitting, ask your health care provider.  Do not use street drugs.  Do not share needles.  Ask your health care  provider for help if you need support or information about quitting drugs. Alcohol use  Do not drink  alcohol if: ? Your health care provider tells you not to drink. ? You are pregnant, may be pregnant, or are planning to become pregnant.  If you drink alcohol: ? Limit how much you use to 0-1 drink a day. ? Limit intake if you are breastfeeding.  Be aware of how much alcohol is in your drink. In the [place] one drink equals one 12 oz bottle of beer (355 mL), one 5 oz glass of wine (148 mL), or one 1 oz glass of hard liquor (44 mL). General instructions  Schedule regular health, dental, and eye exams.  Stay current with your vaccines.  Tell your health care provider if: ? You often feel depressed. ? You have ever been abused or do not feel safe at home. Summary  Adopting a healthy lifestyle and getting preventive care are important in promoting health and wellness.  Follow your health care provider's instructions about healthy diet, exercising, and getting tested or screened for diseases.  Follow your health care provider's instructions on monitoring your cholesterol and blood pressure. This information is not intended to replace advice given to you by your health care provider. Make sure you discuss any questions you have with your health care provider. Document Revised: 02/01/2018 Document Reviewed: 02/01/2018 Elsevier Patient Education  2020 [place] Breanna Mcdaniel M.D.

## 2019-10-08 NOTE — Patient Instructions (Addendum)
Will notify you  of labs when available.  Get appt for bone density [facility] lab    Health Maintenance, Female Adopting a healthy lifestyle and getting preventive care are important in promoting health and wellness. Ask your health care provider about:  The right schedule for you to have regular tests and exams.  Things you can do on your own to prevent diseases and keep yourself healthy. What should I know about diet, weight, and exercise? Eat a healthy diet   Eat a diet that includes plenty of vegetables, fruits, low-fat dairy products, and lean protein.  Do not eat a lot of foods that are high in solid fats, added sugars, or sodium. Maintain a healthy weight Body mass index (BMI) is used to identify weight problems. It estimates body fat based on height and weight. Your health care provider can help determine your BMI and help you achieve or maintain a healthy weight. Get regular exercise Get regular exercise. This is one of the most important things you can do for your health. Most adults should:  Exercise for at least 150 minutes each week. The exercise should increase your heart rate and make you sweat (moderate-intensity exercise).  Do strengthening exercises at least twice a week. This is in addition to the moderate-intensity exercise.  Spend less time sitting. Even light physical activity can be beneficial. Watch cholesterol and blood lipids Have your blood tested for lipids and cholesterol at 74 years of age, then have this test every 5 years. Have your cholesterol levels checked more often if:  Your lipid or cholesterol levels are high.  You are older than 74 years of age.  You are at high risk for heart disease. What should I know about cancer screening? Depending on your health history and family history, you may need to have cancer screening at various ages. This may include screening for:  Breast cancer.  Cervical cancer.  Colorectal cancer.  Skin  cancer.  Lung cancer. What should I know about heart disease, diabetes, and high blood pressure? Blood pressure and heart disease  High blood pressure causes heart disease and increases the risk of stroke. This is more likely to develop in people who have high blood pressure readings, are of African descent, or are overweight.  Have your blood pressure checked: ? Every 3-5 years if you are 75-53 years of age. ? Every year if you are 76 years old or older. Diabetes Have regular diabetes screenings. This checks your fasting blood sugar level. Have the screening done:  Once every three years after age 31 if you are at a normal weight and have a low risk for diabetes.  More often and at a younger age if you are overweight or have a high risk for diabetes. What should I know about preventing infection? Hepatitis B If you have a higher risk for hepatitis B, you should be screened for this virus. Talk with your health care provider to find out if you are at risk for hepatitis B infection. Hepatitis C Testing is recommended for:  Everyone born from [birth year] through 1965.  Anyone with known risk factors for hepatitis C. Sexually transmitted infections (STIs)  Get screened for STIs, including gonorrhea and chlamydia, if: ? You are sexually active and are younger than 74 years of age. ? You are older than 73 years of age and your health care provider tells you that you are at risk for this type of infection. ? Your sexual activity has changed since you  were last screened, and you are at increased risk for chlamydia or gonorrhea. Ask your health care provider if you are at risk.  Ask your health care provider about whether you are at high risk for HIV. Your health care provider may recommend a prescription medicine to help prevent HIV infection. If you choose to take medicine to prevent HIV, you should first get tested for HIV. You should then be tested every 3 months for as long as you are taking  the medicine. Pregnancy  If you are about to stop having your period (premenopausal) and you may become pregnant, seek counseling before you get pregnant.  Take 400 to 800 micrograms (mcg) of folic acid every day if you become pregnant.  Ask for birth control (contraception) if you want to prevent pregnancy. Osteoporosis and menopause Osteoporosis is a disease in which the bones lose minerals and strength with aging. This can result in bone fractures. If you are 18 years old or older, or if you are at risk for osteoporosis and fractures, ask your health care provider if you should:  Be screened for bone loss.  Take a calcium or vitamin D supplement to lower your risk of fractures.  Be given hormone replacement therapy (HRT) to treat symptoms of menopause. Follow these instructions at home: Lifestyle  Do not use any products that contain nicotine or tobacco, such as cigarettes, e-cigarettes, and chewing tobacco. If you need help quitting, ask your health care provider.  Do not use street drugs.  Do not share needles.  Ask your health care provider for help if you need support or information about quitting drugs. Alcohol use  Do not drink alcohol if: ? Your health care provider tells you not to drink. ? You are pregnant, may be pregnant, or are planning to become pregnant.  If you drink alcohol: ? Limit how much you use to 0-1 drink a day. ? Limit intake if you are breastfeeding.  Be aware of how much alcohol is in your drink. In the [place] one drink equals one 12 oz bottle of beer (355 mL), one 5 oz glass of wine (148 mL), or one 1 oz glass of hard liquor (44 mL). General instructions  Schedule regular health, dental, and eye exams.  Stay current with your vaccines.  Tell your health care provider if: ? You often feel depressed. ? You have ever been abused or do not feel safe at home. Summary  Adopting a healthy lifestyle and getting preventive care are important in  promoting health and wellness.  Follow your health care provider's instructions about healthy diet, exercising, and getting tested or screened for diseases.  Follow your health care provider's instructions on monitoring your cholesterol and blood pressure. This information is not intended to replace advice given to you by your health care provider. Make sure you discuss any questions you have with your health care provider. Document Revised: 02/01/2018 Document Reviewed: 02/01/2018 Elsevier Patient Education  2020 [employer].

## 2019-10-09 ENCOUNTER — Other Ambulatory Visit: Payer: Self-pay

## 2019-10-09 ENCOUNTER — Ambulatory Visit (INDEPENDENT_AMBULATORY_CARE_PROVIDER_SITE_OTHER): Payer: PPO | Admitting: Internal Medicine

## 2019-10-09 ENCOUNTER — Encounter: Payer: Self-pay | Admitting: Internal Medicine

## 2019-10-09 VITALS — BP 126/74 | HR 71 | Temp 97.8°F | Ht 62.1 in | Wt 138.0 lb

## 2019-10-09 DIAGNOSIS — Z0000 Encounter for general adult medical examination without abnormal findings: Secondary | ICD-10-CM | POA: Diagnosis not present

## 2019-10-09 DIAGNOSIS — Z9109 Other allergy status, other than to drugs and biological substances: Secondary | ICD-10-CM

## 2019-10-09 DIAGNOSIS — E7800 Pure hypercholesterolemia, unspecified: Secondary | ICD-10-CM

## 2019-10-09 DIAGNOSIS — E2839 Other primary ovarian failure: Secondary | ICD-10-CM

## 2019-10-09 DIAGNOSIS — Z79899 Other long term (current) drug therapy: Secondary | ICD-10-CM | POA: Diagnosis not present

## 2019-10-10 LAB — HEPATIC FUNCTION PANEL
AG Ratio: 1.8 (calc) (ref 1.0–2.5)
ALT: 21 U/L (ref 6–29)
AST: 18 U/L (ref 10–35)
Albumin: 4.2 g/dL (ref 3.6–5.1)
Alkaline phosphatase (APISO): 83 U/L (ref 37–153)
Bilirubin, Direct: 0.1 mg/dL (ref 0.0–0.2)
Globulin: 2.4 g/dL (calc) (ref 1.9–3.7)
Indirect Bilirubin: 0.5 mg/dL (calc) (ref 0.2–1.2)
Total Bilirubin: 0.6 mg/dL (ref 0.2–1.2)
Total Protein: 6.6 g/dL (ref 6.1–8.1)

## 2019-10-10 LAB — CBC WITH DIFFERENTIAL/PLATELET
Absolute Monocytes: 392 cells/uL (ref 200–950)
Basophils Absolute: 31 cells/uL (ref 0–200)
Basophils Relative: 0.7 %
Eosinophils Absolute: 79 cells/uL (ref 15–500)
Eosinophils Relative: 1.8 %
HCT: 45.1 % — ABNORMAL HIGH (ref 35.0–45.0)
Hemoglobin: 15 g/dL (ref 11.7–15.5)
Lymphs Abs: 1518 cells/uL (ref 850–3900)
MCH: 30.5 pg (ref 27.0–33.0)
MCHC: 33.3 g/dL (ref 32.0–36.0)
MCV: 91.9 fL (ref 80.0–100.0)
MPV: 11.2 fL (ref 7.5–12.5)
Monocytes Relative: 8.9 %
Neutro Abs: 2380 cells/uL (ref 1500–7800)
Neutrophils Relative %: 54.1 %
Platelets: 254 10*3/uL (ref 140–400)
RBC: 4.91 10*6/uL (ref 3.80–5.10)
RDW: 12.8 % (ref 11.0–15.0)
Total Lymphocyte: 34.5 %
WBC: 4.4 10*3/uL (ref 3.8–10.8)

## 2019-10-10 LAB — BASIC METABOLIC PANEL
BUN: 19 mg/dL (ref 7–25)
CO2: 26 mmol/L (ref 20–32)
Calcium: 9.3 mg/dL (ref 8.6–10.4)
Chloride: 107 mmol/L (ref 98–110)
Creat: 0.69 mg/dL (ref 0.60–0.93)
Glucose, Bld: 99 mg/dL (ref 65–99)
Potassium: 4.4 mmol/L (ref 3.5–5.3)
Sodium: 141 mmol/L (ref 135–146)

## 2019-10-10 LAB — LIPID PANEL
Cholesterol: 177 mg/dL (ref <200)
HDL: 67 mg/dL (ref >=50)
LDL Cholesterol (Calc): 97 mg/dL (calc)
Non-HDL Cholesterol (Calc): 110 mg/dL (calc) (ref <130)
Total CHOL/HDL Ratio: 2.6 (calc) (ref <5.0)
Triglycerides: 44 mg/dL (ref <150)

## 2019-10-10 LAB — TSH: TSH: 2.39 mIU/L (ref 0.40–4.50)

## 2019-10-10 NOTE — Progress Notes (Signed)
Normal or acceptable results   .  Continue lhealthy eating and exercise .

## 2019-10-30 DIAGNOSIS — H2513 Age-related nuclear cataract, bilateral: Secondary | ICD-10-CM | POA: Diagnosis not present

## 2019-10-30 DIAGNOSIS — H52203 Unspecified astigmatism, bilateral: Secondary | ICD-10-CM | POA: Diagnosis not present

## 2019-10-31 ENCOUNTER — Ambulatory Visit (INDEPENDENT_AMBULATORY_CARE_PROVIDER_SITE_OTHER)
Admission: RE | Admit: 2019-10-31 | Discharge: 2019-10-31 | Disposition: A | Discharge status: Home / Self Care | Payer: PPO | Source: Ambulatory Visit | Attending: Internal Medicine | Admitting: Internal Medicine

## 2019-10-31 ENCOUNTER — Other Ambulatory Visit: Payer: Self-pay

## 2019-10-31 DIAGNOSIS — E7800 Pure hypercholesterolemia, unspecified: Secondary | ICD-10-CM

## 2019-10-31 DIAGNOSIS — Z79899 Other long term (current) drug therapy: Secondary | ICD-10-CM

## 2019-10-31 DIAGNOSIS — E2839 Other primary ovarian failure: Secondary | ICD-10-CM

## 2019-10-31 DIAGNOSIS — Z9109 Other allergy status, other than to drugs and biological substances: Secondary | ICD-10-CM

## 2019-10-31 NOTE — Progress Notes (Signed)
Mild osteopenia stable

## 2019-11-21 ENCOUNTER — Ambulatory Visit (INDEPENDENT_AMBULATORY_CARE_PROVIDER_SITE_OTHER): Payer: PPO

## 2019-11-21 ENCOUNTER — Other Ambulatory Visit: Payer: Self-pay

## 2019-11-21 DIAGNOSIS — Z23 Encounter for immunization: Secondary | ICD-10-CM | POA: Diagnosis not present

## 2019-11-21 NOTE — Progress Notes (Signed)
Patient was given a high dose flu vaccine in her Left Deltoid today.  Patient tolerated the injection well.

## 2020-01-09 ENCOUNTER — Ambulatory Visit (INDEPENDENT_AMBULATORY_CARE_PROVIDER_SITE_OTHER): Payer: PPO

## 2020-01-09 DIAGNOSIS — Z0000 Encounter for general adult medical examination without abnormal findings: Secondary | ICD-10-CM

## 2020-01-09 NOTE — Patient Instructions (Signed)
Cynthia Miller , Thank you for taking time to come for your Medicare Wellness Visit. I appreciate your ongoing commitment to your health goals. Please review the following plan we discussed and let me know if I can assist you in the future.   Screening recommendations/referrals: Colonoscopy: Up to date, next due 07/22/2020 Mammogram: Up to date next due 02/28/2020 Bone Density: Up to date next due 10/30/2024 Recommended yearly ophthalmology/optometry visit for glaucoma screening and checkup Recommended yearly dental visit for hygiene and checkup  Vaccinations: Influenza vaccine: Up to date, next due fall 2022 Pneumococcal vaccine: Completed series  Tdap vaccine: Up to date, next due 10/08/2028 Shingles vaccine: Completed series     Advanced directives: Please bring in copies of your advanced medica directives so that we may scan them into your chart.  Conditions/risks identified: None   Next appointment: 10/15/2020 @ 10:00 am with Dr. Regis Bill    Preventive Care 65 Years and Older, Female Preventive care refers to lifestyle choices and visits with your health care provider that can promote health and wellness. What does preventive care include?  A yearly physical exam. This is also called an annual well check.  Dental exams once or twice a year.  Routine eye exams. Ask your health care provider how often you should have your eyes checked.  Personal lifestyle choices, including:  Daily care of your teeth and gums.  Regular physical activity.  Eating a healthy diet.  Avoiding tobacco and drug use.  Limiting alcohol use.  Practicing safe sex.  Taking low-dose aspirin every day.  Taking vitamin and mineral supplements as recommended by your health care provider. What happens during an annual well check? The services and screenings done by your health care provider during your annual well check will depend on your age, overall health, lifestyle risk factors, and family  history of disease. Counseling  Your health care provider may ask you questions about your:  Alcohol use.  Tobacco use.  Drug use.  Emotional well-being.  Home and relationship well-being.  Sexual activity.  Eating habits.  History of falls.  Memory and ability to understand (cognition).  Work and work Statistician.  Reproductive health. Screening  You may have the following tests or measurements:  Height, weight, and BMI.  Blood pressure.  Lipid and cholesterol levels. These may be checked every 5 years, or more frequently if you are over 41 years old.  Skin check.  Lung cancer screening. You may have this screening every year starting at age 88 if you have a 30-pack-year history of smoking and currently smoke or have quit within the past 15 years.  Fecal occult blood test (FOBT) of the stool. You may have this test every year starting at age 51.  Flexible sigmoidoscopy or colonoscopy. You may have a sigmoidoscopy every 5 years or a colonoscopy every 10 years starting at age 14.  Hepatitis C blood test.  Hepatitis B blood test.  Sexually transmitted disease (STD) testing.  Diabetes screening. This is done by checking your blood sugar (glucose) after you have not eaten for a while (fasting). You may have this done every 1-3 years.  Bone density scan. This is done to screen for osteoporosis. You may have this done starting at age 86.  Mammogram. This may be done every 1-2 years. Talk to your health care provider about how often you should have regular mammograms. Talk with your health care provider about your test results, treatment options, and if necessary, the need for more tests.  Vaccines  Your health care provider may recommend certain vaccines, such as:  Influenza vaccine. This is recommended every year.  Tetanus, diphtheria, and acellular pertussis (Tdap, Td) vaccine. You may need a Td booster every 10 years.  Zoster vaccine. You may need this after  age 57.  Pneumococcal 13-valent conjugate (PCV13) vaccine. One dose is recommended after age 38.  Pneumococcal polysaccharide (PPSV23) vaccine. One dose is recommended after age 63. Talk to your health care provider about which screenings and vaccines you need and how often you need them. This information is not intended to replace advice given to you by your health care provider. Make sure you discuss any questions you have with your health care provider. Document Released: 03/07/2015 Document Revised: 10/29/2015 Document Reviewed: 12/10/2014 Elsevier Interactive Patient Education  2017 [place] Prevention in the Home Falls can cause injuries. They can happen to people of all ages. There are many things you can do to make your home safe and to help prevent falls. What can I do on the outside of my home?  Regularly fix the edges of walkways and driveways and fix any cracks.  Remove anything that might make you trip as you walk through a door, such as a raised step or threshold.  Trim any bushes or trees on the path to your home.  Use bright outdoor lighting.  Clear any walking paths of anything that might make someone trip, such as rocks or tools.  Regularly check to see if handrails are loose or broken. Make sure that both sides of any steps have handrails.  Any raised decks and porches should have guardrails on the edges.  Have any leaves, snow, or ice cleared regularly.  Use sand or salt on walking paths during winter.  Clean up any spills in your garage right away. This includes oil or grease spills. What can I do in the bathroom?  Use night lights.  Install grab bars by the toilet and in the tub and shower. Do not use towel bars as grab bars.  Use non-skid mats or decals in the tub or shower.  If you need to sit down in the shower, use a plastic, non-slip stool.  Keep the floor dry. Clean up any water that spills on the floor as soon as it  happens.  Remove soap buildup in the tub or shower regularly.  Attach bath mats securely with double-sided non-slip rug tape.  Do not have throw rugs and other things on the floor that can make you trip. What can I do in the bedroom?  Use night lights.  Make sure that you have a light by your bed that is easy to reach.  Do not use any sheets or blankets that are too big for your bed. They should not hang down onto the floor.  Have a firm chair that has side arms. You can use this for support while you get dressed.  Do not have throw rugs and other things on the floor that can make you trip. What can I do in the [place]?  Clean up any spills right away.  Avoid walking on wet floors.  Keep items that you use a lot in easy-to-reach places.  If you need to reach something above you, use a strong step stool that has a grab bar.  Keep electrical cords out of the way.  Do not use floor polish or wax that makes floors slippery. If you must use wax, use non-skid floor wax.  Do not have throw rugs and other things on the floor that can make you trip. What can I do with my stairs?  Do not leave any items on the stairs.  Make sure that there are handrails on both sides of the stairs and use them. Fix handrails that are broken or loose. Make sure that handrails are as long as the stairways.  Check any carpeting to make sure that it is firmly attached to the stairs. Fix any carpet that is loose or worn.  Avoid having throw rugs at the top or bottom of the stairs. If you do have throw rugs, attach them to the floor with carpet tape.  Make sure that you have a light switch at the top of the stairs and the bottom of the stairs. If you do not have them, ask someone to add them for you. What else can I do to help prevent falls?  Wear shoes that:  Do not have high heels.  Have rubber bottoms.  Are comfortable and fit you well.  Are closed at the toe. Do not wear sandals.  If you  use a stepladder:  Make sure that it is fully opened. Do not climb a closed stepladder.  Make sure that both sides of the stepladder are locked into place.  Ask someone to hold it for you, if possible.  Clearly mark and make sure that you can see:  Any grab bars or handrails.  First and last steps.  Where the edge of each step is.  Use tools that help you move around (mobility aids) if they are needed. These include:  Canes.  Walkers.  Scooters.  Crutches.  Turn on the lights when you go into a dark area. Replace any light bulbs as soon as they burn out.  Set up your furniture so you have a clear path. Avoid moving your furniture around.  If any of your floors are uneven, fix them.  If there are any pets around you, be aware of where they are.  Review your medicines with your doctor. Some medicines can make you feel dizzy. This can increase your chance of falling. Ask your doctor what other things that you can do to help prevent falls. This information is not intended to replace advice given to you by your health care provider. Make sure you discuss any questions you have with your health care provider. Document Released: 12/05/2008 Document Revised: 07/17/2015 Document Reviewed: 03/15/2014 Elsevier Interactive Patient Education  2017 [employer].

## 2020-01-09 NOTE — Progress Notes (Signed)
Subjective:   Cynthia Miller is a 74 y.o. female who presents for Medicare Annual (Subsequent) preventive examination.  I connected with Delberta Folts today by telephone and verified that I am speaking with the correct person using two identifiers. Location patient: home Location provider: work Persons participating in the virtual visit: patient, provider.   I discussed the limitations, risks, security and privacy concerns of performing an evaluation and management service by telephone and the availability of in person appointments. I also discussed with the patient that there may be a patient responsible charge related to this service. The patient expressed understanding and verbally consented to this telephonic visit.    Interactive audio and video telecommunications were attempted between this provider and patient, however failed, due to patient having technical difficulties OR patient did not have access to video capability.  We continued and completed visit with audio only.     Review of Systems    N/A  Cardiac Risk Factors include: advanced age (>70men, >37 women)     Objective:    There were no vitals filed for this visit. There is no height or weight on file to calculate BMI.  Advanced Directives 01/09/2020 07/09/2016  Does Patient Have a Medical Advance Directive? Yes No  Type of Advance Directive Living will;Healthcare Power of Attorney -  Does patient want to make changes to medical advance directive? No - Patient declined -  Copy of [place] in Chart? No - copy requested -    Current Medications (verified) Outpatient Encounter Medications as of 01/09/2020  Medication Sig  . levocetirizine (XYZAL) 5 MG tablet Take 1 tablet (5 mg total) by mouth every evening.  . montelukast (SINGULAIR) 10 MG tablet Take 1 tablet (10 mg total) by mouth at bedtime.  . tretinoin (RETIN-A) 0.05 % cream Apply 1 application topically at bedtime.   No  facility-administered encounter medications on file as of 01/09/2020.    Allergies (verified) Penicillins   History: Past Medical History:  Diagnosis Date  . ALLERGIC RHINITIS   . CIN I (cervical intraepithelial neoplasia I)   . FIBROCYSTIC BREAST DISEASE   . Hx of varicella   . Kidney stones 07/08/2016  . Low blood pressure   . Osteoarthritis    THUMB  . Osteopenia   . Tinnitus of both ears    L>R, follows with ENT for same   Past Surgical History:  Procedure Laterality Date  . BREAST BIOPSY Left   . BREAST CYST ASPIRATION    . BREAST CYST EXCISION Left   . CERVICAL CONE BIOPSY    . COLONOSCOPY    . COLPOSCOPY    . LAPAROSCOPIC TUBAL LIGATION    . RIGHT FOOT     bednarz  . TUBAL LIGATION     Family History  Problem Relation Age of Onset  . Diverticulosis Father   . Lung cancer Father        brain mets,did smoke  . Hyperlipidemia Mother        died age 76  . Thyroid disease Mother   . Breast cancer Paternal Grandmother   . Healthy Brother   . Colon cancer Neg Hx    Social History   Socioeconomic History  . Marital status: Married    Spouse name: Not on file  . Number of children: 1  . Years of education: 4  . Highest education level: Not on file  Occupational History  . Occupation: Environmental education officer: RETIRED  Tobacco Use  . Smoking status: Never Smoker  . Smokeless tobacco: Never Used  Vaping Use  . Vaping Use: Never used  Substance and Sexual Activity  . Alcohol use: Yes    Alcohol/week: 14.0 standard drinks    Types: 14 Glasses of wine per week  . Drug use: No  . Sexual activity: Yes    Partners: Male    Birth control/protection: Surgical  Other Topics Concern  . Not on file  Social History Narrative   HS[employer], [employer] in [place] to finish BA Art history. Married 1969 - 10 yr/divorce. Remarried '83 - 10 yrs/divorced. Remarried '97 -. 1 dtr - '74. 2 grandchildren. Work - volunteering. Marriage in good health.     Social Determinants of Health   Financial Resource Strain: Low Risk   . Difficulty of Paying Living Expenses: Not hard at all  Food Insecurity: No Food Insecurity  . Worried About Charity fundraiser in the Last Year: Never true  . Ran Out of Food in the Last Year: Never true  Transportation Needs: No Transportation Needs  . Lack of Transportation (Medical): No  . Lack of Transportation (Non-Medical): No  Physical Activity: Sufficiently Active  . Days of Exercise per Week: 7 days  . Minutes of Exercise per Session: 60 min  Stress: No Stress Concern Present  . Feeling of Stress : Not at all  Social Connections: Moderately Isolated  . Frequency of Communication with Friends and Family: More than three times a week  . Frequency of Social Gatherings with Friends and Family: More than three times a week  . Attends Religious Services: Never  . Active Member of Clubs or Organizations: No  . Attends Archivist Meetings: Never  . Marital Status: Married    Tobacco Counseling Counseling given: Not Answered   Clinical Intake:  Pre-visit preparation completed: Yes  Pain : No/denies pain     Nutritional Risks: None Diabetes: No  How often do you need to have someone help you when you read instructions, pamphlets, or other written materials from your doctor or pharmacy?: 1 - Never What is the last grade level you completed in school?: College  Diabetic?No     Information entered by :: [place] of Daily Living In your present state of health, do you have any difficulty performing the following activities: 01/09/2020  Hearing? N  Vision? N  Difficulty concentrating or making decisions? N  Walking or climbing stairs? N  Dressing or bathing? N  Doing errands, shopping? N  Preparing Food and eating ? N  Using the Toilet? N  In the past six months, have you accidently leaked urine? N  Do you have problems with loss of bowel control? N  Managing  your Medications? N  Managing your Finances? N  Housekeeping or managing your Housekeeping? N  Some recent data might be hidden    Patient Care Team: Panosh, Standley Brooking, MD as PCP - General (Internal Medicine) Irene Shipper, MD (Gastroenterology) Jari Pigg, MD (Dermatology) Stefanie Libel, MD (Sports Medicine) Mosetta Anis, MD (Allergy) Leta Baptist, MD (Otolaryngology) Luberta Mutter, MD as Consulting Physician (Ophthalmology)  Indicate any recent Medical Services you may have received from other than [facility] providers in the past year (date may be approximate).     Assessment:   This is a routine wellness examination for Kleo.  Hearing/Vision screen  Hearing Screening   125Hz  250Hz  500Hz  1000Hz  2000Hz  3000Hz  4000Hz  6000Hz  8000Hz   Right  ear:           Left ear:           Vision Screening Comments: Patient states that she gets eyes examined yearly. Has small cataracts   Dietary issues and exercise activities discussed: Current Exercise Habits: Home exercise routine, Type of exercise: walking;strength training/weights, Time (Minutes): 60, Frequency (Times/Week): 7, Weekly Exercise (Minutes/Week): 420, Intensity: Moderate, Exercise limited by: None identified  Goals    . Patient Stated     I will continue to walk 3-6 miles per day       Depression Screen PHQ 2/9 Scores 01/09/2020 10/09/2019 09/20/2018 07/22/2017 07/22/2015 04/17/2014 02/06/2013  PHQ - 2 Score 0 0 0 0 0 0 0  PHQ- 9 Score 0 0 - - - - -    Fall Risk Fall Risk  01/09/2020 10/09/2019 07/22/2017 07/22/2015 04/17/2014  Falls in the past year? 0 0 No No No  Number falls in past yr: 0 - - - -  Injury with Fall? 0 - - - -  Risk for fall due to : No Fall Risks - - - -  Follow up Falls evaluation completed;Falls prevention discussed - - - -    Any stairs in or around the home? Yes  If so, are there any without handrails? No  Home free of loose throw rugs in walkways, pet beds, electrical cords, etc? Yes  Adequate lighting  in your home to reduce risk of falls? Yes   ASSISTIVE DEVICES UTILIZED TO PREVENT FALLS:  Life alert? No  Use of a cane, walker or w/c? No  Grab bars in the bathroom? No  Shower chair or bench in shower? No  Elevated toilet seat or a handicapped toilet? No     Cognitive Function:  Cognitive screening not indicated based on direct observation      Immunizations Immunization History  Administered Date(s) Administered  . Fluad Quad(high Dose 65+) 11/15/2018, 11/21/2019  . Influenza Split 11/24/2011, 12/06/2012, 10/31/2013  . Influenza, High Dose Seasonal PF 11/24/2015, 11/04/2017  . PFIZER SARS-COV-2 Vaccination 03/11/2019, 04/01/2019  . Pneumococcal Conjugate-13 02/06/2013  . Pneumococcal Polysaccharide-23 11/07/2007, 09/14/2013  . Td 11/07/2007  . Tdap 10/09/2018  . Zoster 09/01/2009  . Zoster Recombinat (Shingrix) 05/30/2017, 09/09/2017    TDAP status: Up to date Flu Vaccine status: Up to date Pneumococcal vaccine status: Up to date Covid-19 vaccine status: Completed vaccines  Qualifies for Shingles Vaccine? Yes   Zostavax completed Yes   Shingrix Completed?: Yes  Screening Tests Health Maintenance  Topic Date Due  . MAMMOGRAM  02/28/2020  . COLONOSCOPY  07/22/2020  . TETANUS/TDAP  10/08/2028  . INFLUENZA VACCINE  Completed  . DEXA SCAN  Completed  . COVID-19 Vaccine  Completed  . Hepatitis C Screening  Completed  . PNA vac Low Risk Adult  Completed    Health Maintenance  There are no preventive care reminders to display for this patient.  Colorectal cancer screening: Completed 07/23/2010. Repeat every 10 years Mammogram status: Completed 02/28/2019. Repeat every year Bone Density status: Completed 10/31/2019. Results reflect: Bone density results: OSTEOPENIA. Repeat every 5 years.  Lung Cancer Screening: (Low Dose CT Chest recommended if Age 63-80 years, 30 pack-year currently smoking OR have quit w/in 15years.) does not qualify.   Lung Cancer  Screening Referral: N/A   Additional Screening:  Hepatitis C Screening: does qualify; Completed  07/29/2017  Vision Screening: Recommended annual ophthalmology exams for early detection of glaucoma and other disorders of the eye. Is the  patient up to date with their annual eye exam?  Yes  Who is the provider or what is the name of the office in which the patient attends annual eye exams? Dr.Christine McCurren  If pt is not established with a provider, would they like to be referred to a provider to establish care? No .   Dental Screening: Recommended annual dental exams for proper oral hygiene  Community Resource Referral / Chronic Care Management: CRR required this visit?  No   CCM required this visit?  No      Plan:     I have personally reviewed and noted the following in the patient's chart:   . Medical and social history . Use of alcohol, tobacco or illicit drugs  . Current medications and supplements . Functional ability and status . Nutritional status . Physical activity . Advanced directives . List of other physicians . Hospitalizations, surgeries, and ER visits in previous 12 months . Vitals . Screenings to include cognitive, depression, and falls . Referrals and appointments  In addition, I have reviewed and discussed with patient certain preventive protocols, quality metrics, and best practice recommendations. A written personalized care plan for preventive services as well as general preventive health recommendations were provided to patient.     Ofilia Neas, LPN   [id number]   Nurse Notes: None

## 2020-02-11 ENCOUNTER — Other Ambulatory Visit: Payer: Self-pay | Admitting: Internal Medicine

## 2020-02-11 DIAGNOSIS — Z1231 Encounter for screening mammogram for malignant neoplasm of breast: Secondary | ICD-10-CM

## 2020-02-27 DIAGNOSIS — J3089 Other allergic rhinitis: Secondary | ICD-10-CM | POA: Diagnosis not present

## 2020-03-06 DIAGNOSIS — D2261 Melanocytic nevi of right upper limb, including shoulder: Secondary | ICD-10-CM | POA: Diagnosis not present

## 2020-03-06 DIAGNOSIS — Z411 Encounter for cosmetic surgery: Secondary | ICD-10-CM | POA: Diagnosis not present

## 2020-03-06 DIAGNOSIS — L578 Other skin changes due to chronic exposure to nonionizing radiation: Secondary | ICD-10-CM | POA: Diagnosis not present

## 2020-03-06 DIAGNOSIS — Z86018 Personal history of other benign neoplasm: Secondary | ICD-10-CM | POA: Diagnosis not present

## 2020-03-06 DIAGNOSIS — D2272 Melanocytic nevi of left lower limb, including hip: Secondary | ICD-10-CM | POA: Diagnosis not present

## 2020-03-06 DIAGNOSIS — L821 Other seborrheic keratosis: Secondary | ICD-10-CM | POA: Diagnosis not present

## 2020-03-06 DIAGNOSIS — D225 Melanocytic nevi of trunk: Secondary | ICD-10-CM | POA: Diagnosis not present

## 2020-03-06 DIAGNOSIS — D2372 Other benign neoplasm of skin of left lower limb, including hip: Secondary | ICD-10-CM | POA: Diagnosis not present

## 2020-03-12 ENCOUNTER — Ambulatory Visit: Payer: PPO

## 2020-03-28 ENCOUNTER — Ambulatory Visit
Admission: RE | Admit: 2020-03-28 | Discharge: 2020-03-28 | Disposition: A | Discharge status: Home / Self Care | Payer: PPO | Source: Ambulatory Visit | Attending: Internal Medicine | Admitting: Internal Medicine

## 2020-03-28 ENCOUNTER — Other Ambulatory Visit: Payer: Self-pay

## 2020-03-28 DIAGNOSIS — Z1231 Encounter for screening mammogram for malignant neoplasm of breast: Secondary | ICD-10-CM

## 2020-03-28 IMAGING — MG MM DIGITAL SCREENING BILAT W/ TOMO AND CAD
8 series · 9 of 24 positions shown · non-contrast
Comparison: Previous exam(s).

CLINICAL DATA: Screening.

EXAM:
DIGITAL SCREENING BILATERAL MAMMOGRAM WITH TOMOSYNTHESIS AND CAD
TECHNIQUE: Bilateral screening digital craniocaudal and mediolateral oblique
mammograms were obtained. Bilateral screening digital breast
tomosynthesis was performed. The images were evaluated with
computer-aided detection.

[R MLO synth-2D]
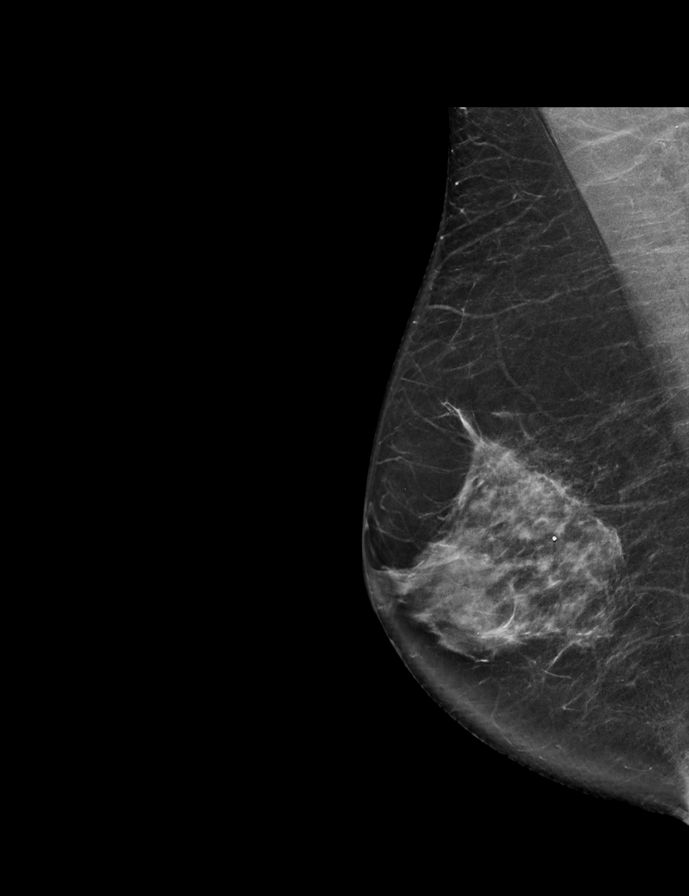

[L CC synth-2D]
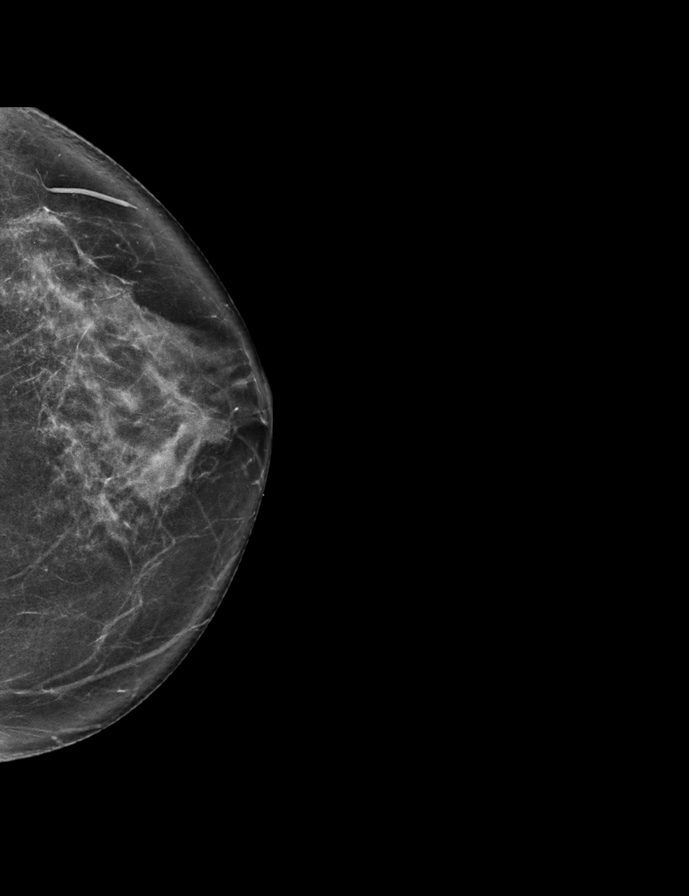

[L MLO synth-2D]
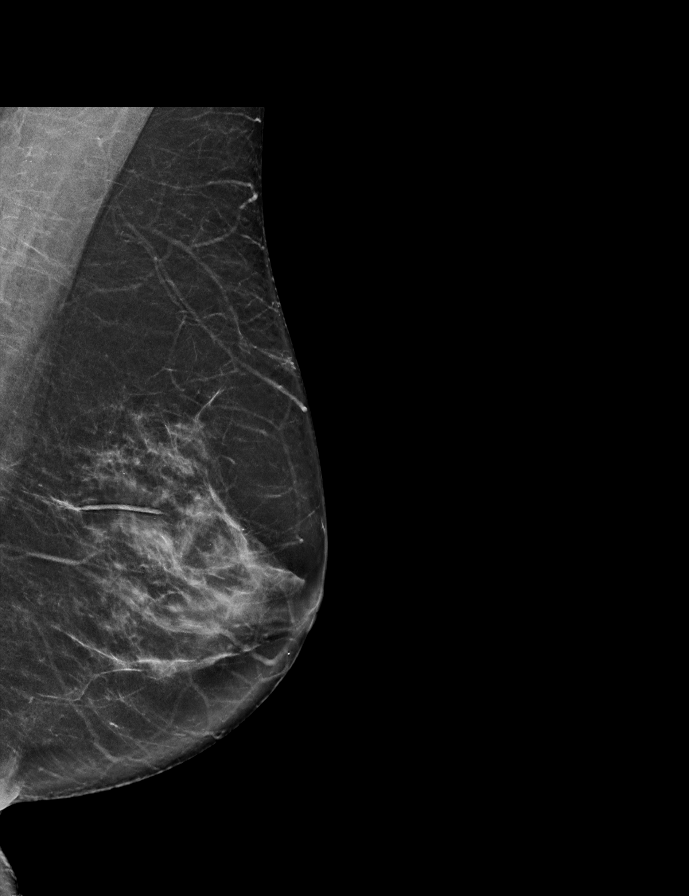

[R CC synth-2D]
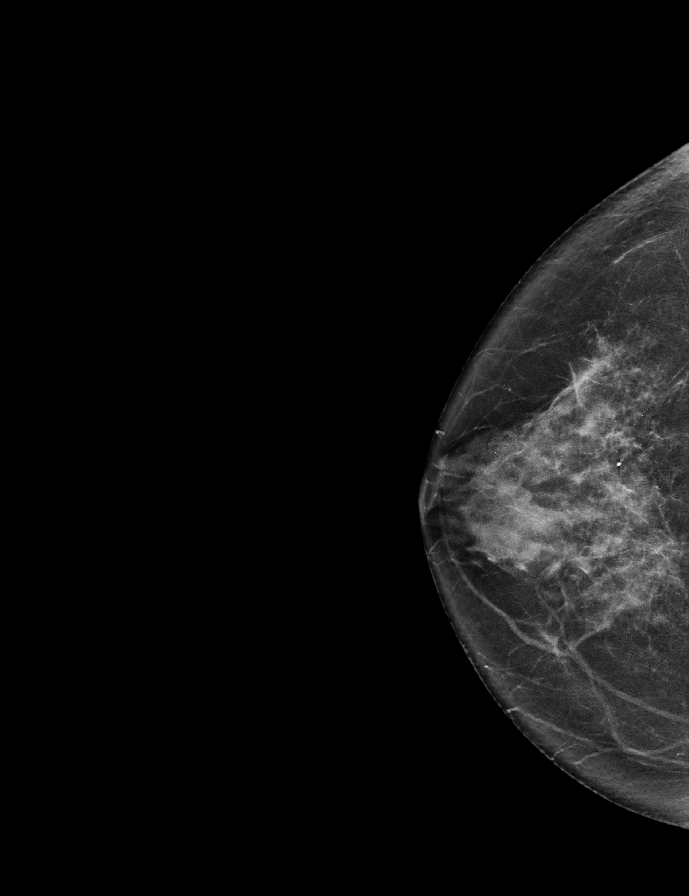

[R MLO tomo · 2 of 73 frames shown]
[frame 24/73]
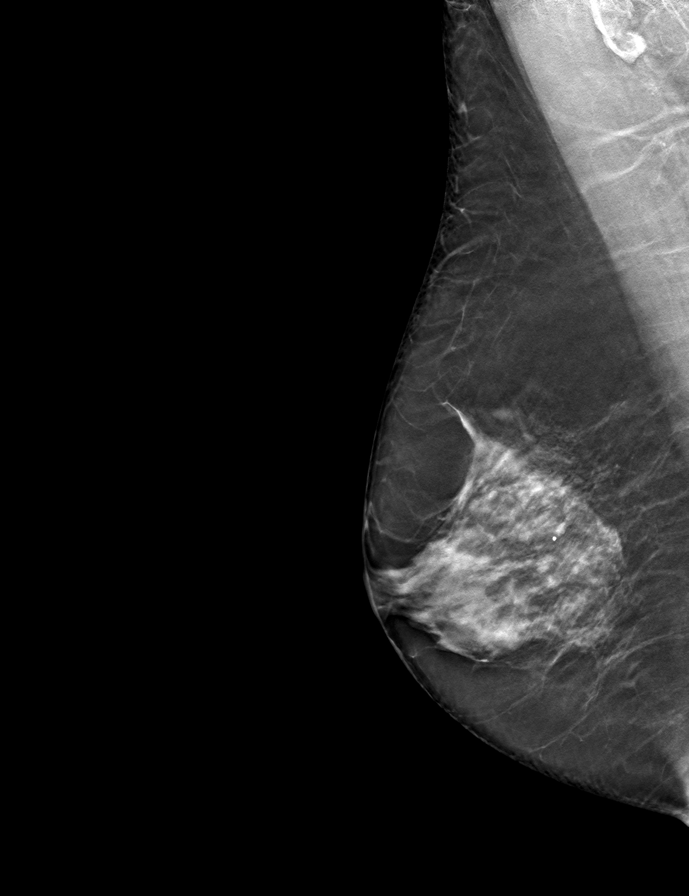
[frame 37/73]
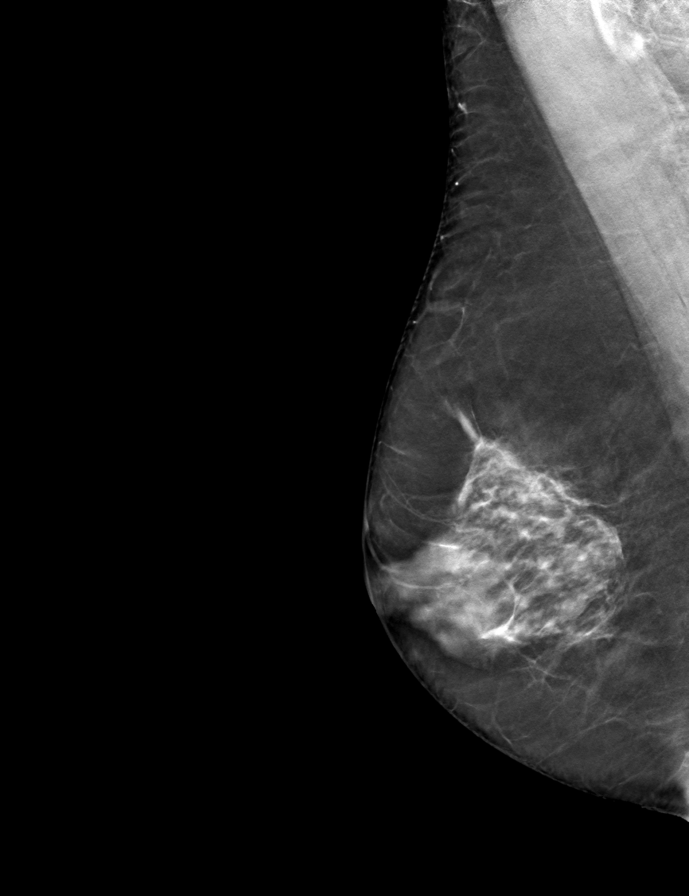

[L MLO tomo · tomo slice 35/68.0]
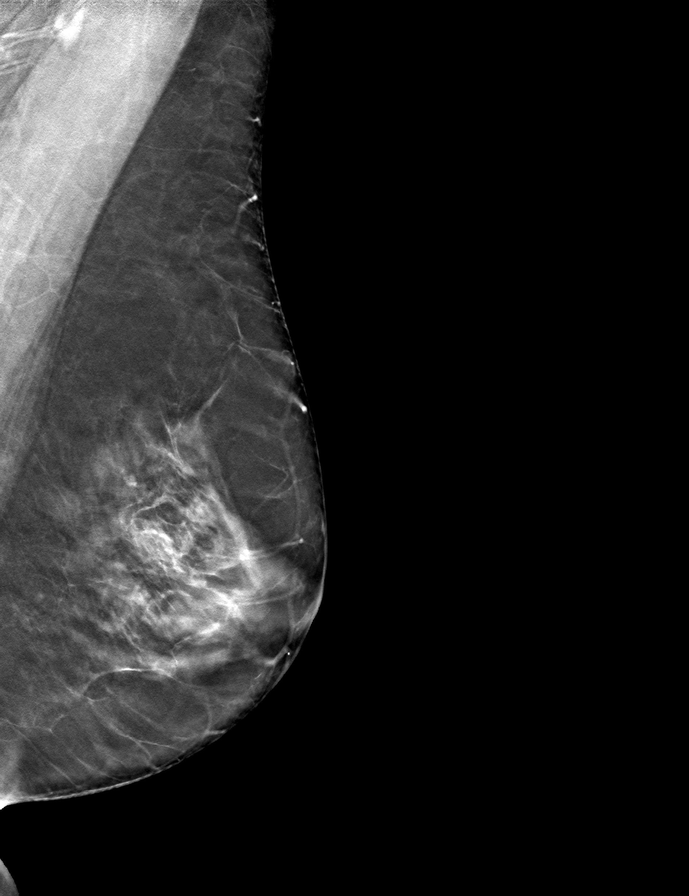

[R CC tomo · tomo slice 35/68.0]
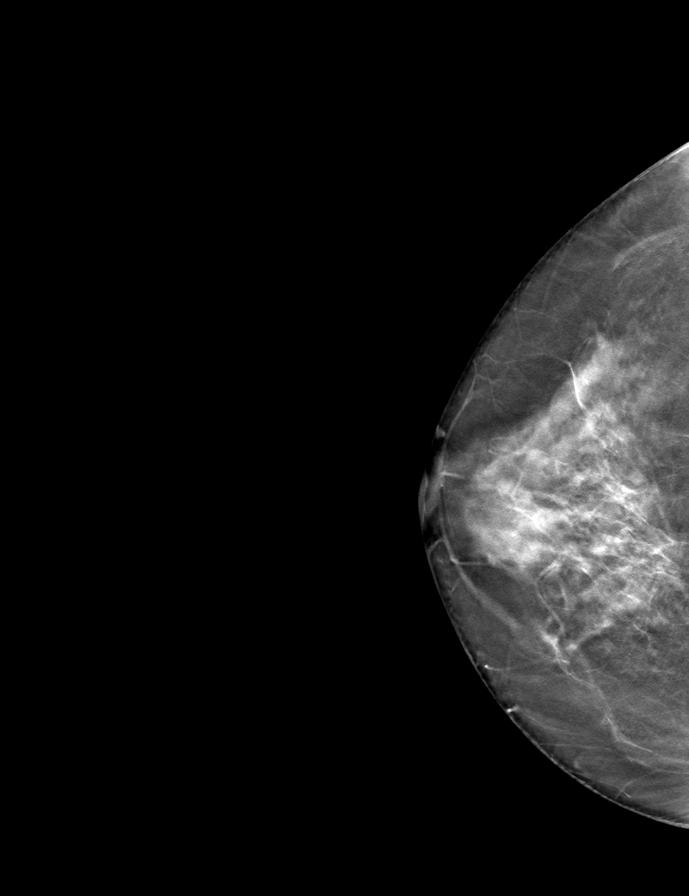

[L CC tomo · tomo slice 34/67.0]
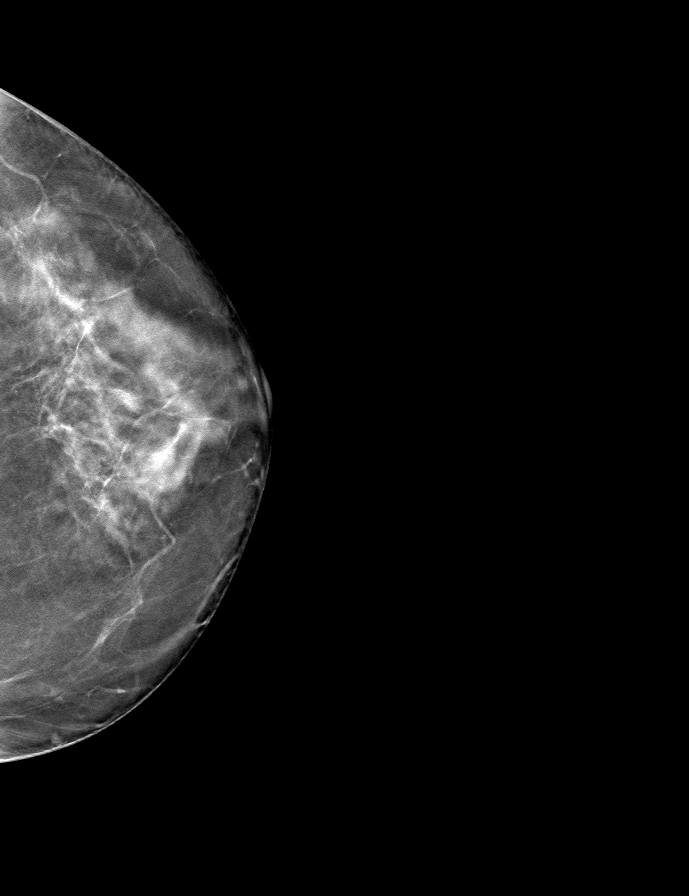

[9 of 24 positions shown; findings below may reference images not displayed]

ACR Breast Density Category c: The breast tissue is heterogeneously
dense, which may obscure small masses.
FINDINGS: There are no findings suspicious for malignancy.
IMPRESSION: No mammographic evidence of malignancy. A result letter of this
screening mammogram will be mailed directly to the patient.

RECOMMENDATION:
Screening mammogram in one year. (Code:[V2])

BI-RADS CATEGORY  1: Negative.

## 2020-06-03 DIAGNOSIS — H0012 Chalazion right lower eyelid: Secondary | ICD-10-CM | POA: Diagnosis not present

## 2020-06-03 DIAGNOSIS — H01004 Unspecified blepharitis left upper eyelid: Secondary | ICD-10-CM | POA: Diagnosis not present

## 2020-06-03 DIAGNOSIS — H01001 Unspecified blepharitis right upper eyelid: Secondary | ICD-10-CM | POA: Diagnosis not present

## 2020-07-16 ENCOUNTER — Ambulatory Visit: Payer: PPO

## 2020-07-17 ENCOUNTER — Ambulatory Visit: Payer: Self-pay

## 2020-07-17 NOTE — Progress Notes (Signed)
   Covid-19 Vaccination Clinic  Name:  CORNELIUS SCHUITEMA    MRN: [id number] DOB: 26-Apr-1945  07/17/2020  Ms. Casa was observed post Covid-19 immunization for 15 minutes without incident. She was provided with Vaccine Information Sheet and instruction to access the V-Safe system.   Ms. Cleere was instructed to call 911 with any severe reactions post vaccine: Marland Early Difficulty breathing  . Swelling of face and throat  . A fast heartbeat  . A bad rash all over body  . Dizziness and weakness

## 2020-07-25 ENCOUNTER — Other Ambulatory Visit (HOSPITAL_BASED_OUTPATIENT_CLINIC_OR_DEPARTMENT_OTHER): Payer: Self-pay

## 2020-07-25 MED ORDER — PFIZER-BIONT COVID-19 VAC-TRIS 30 MCG/0.3ML IM SUSP
INTRAMUSCULAR | 0 refills | Status: DC
  Filled 2020-07-25: qty 0.3, 1d supply, fill #0

## 2020-08-04 ENCOUNTER — Encounter: Payer: Self-pay | Admitting: Family Medicine

## 2020-08-04 ENCOUNTER — Ambulatory Visit: Payer: PPO | Admitting: Family Medicine

## 2020-08-04 ENCOUNTER — Other Ambulatory Visit: Payer: Self-pay

## 2020-08-04 VITALS — BP 122/82 | Ht 62.5 in | Wt 136.0 lb

## 2020-08-04 DIAGNOSIS — M2020 Hallux rigidus, unspecified foot: Secondary | ICD-10-CM | POA: Diagnosis not present

## 2020-08-04 DIAGNOSIS — M79671 Pain in right foot: Secondary | ICD-10-CM

## 2020-08-04 DIAGNOSIS — M79672 Pain in left foot: Secondary | ICD-10-CM | POA: Diagnosis not present

## 2020-08-04 NOTE — Progress Notes (Signed)
PCP: Burnis Medin, MD  Subjective:   HPI: Patient is a 75 y.o. female here for custom orthotics.  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 Custom orthotics made for her at that time felt very comfortable but no longer providing adequate support. Likes to walk. No acute injury or trauma. No new complaints. History of surgery right 1st MTP by Dr. Beola Cord - ? Fusion vs bunionectomy.  Past Medical History:  Diagnosis Date   ALLERGIC RHINITIS    CIN I (cervical intraepithelial neoplasia I)    FIBROCYSTIC BREAST DISEASE    Hx of varicella    Kidney stones 07/08/2016   Low blood pressure    Osteoarthritis    THUMB   Osteopenia    Tinnitus of both ears    L>R, follows with ENT for same    Current Outpatient Medications on File Prior to Visit  Medication Sig Dispense Refill   COVID-19 mRNA Vac-TriS, Pfizer, (PFIZER-BIONT COVID-19 VAC-TRIS) SUSP injection Inject into the muscle. (Patient not taking: Reported on 08/04/2020) 0.3 mL 0   levocetirizine (XYZAL) 5 MG tablet 1 tablet in the evening     montelukast (SINGULAIR) 10 MG tablet take 1 tablet once daily in the evening.     No current facility-administered medications on file prior to visit.    Past Surgical History:  Procedure Laterality Date   BREAST BIOPSY Left    BREAST CYST ASPIRATION     BREAST CYST EXCISION Left    CERVICAL CONE BIOPSY     COLONOSCOPY     COLPOSCOPY     LAPAROSCOPIC TUBAL LIGATION     RIGHT FOOT     bednarz   TUBAL LIGATION      Allergies  Allergen Reactions   Penicillins     REACTION: Hives    Social History   Socioeconomic History   Marital status: Married    Spouse name: Not on file   Number of children: 1   Years of education: 16   Highest education level: Not on file  Occupational History   Occupation: Environmental education officer: RETIRED  Tobacco Use   Smoking status: Never   Smokeless tobacco: Never  Vaping Use   Vaping Use: Never  used  Substance and Sexual Activity   Alcohol use: Yes    Alcohol/week: 14.0 standard drinks    Types: 14 Glasses of wine per week   Drug use: No   Sexual activity: Yes    Partners: Male    Birth control/protection: Surgical  Other Topics Concern   Not on file  Social History Narrative   HSG, [employer] in [place] to finish BA Art history. Married 1969 - 10 yr/divorce. Remarried '83 - 10 yrs/divorced. Remarried '97 -. 1 dtr - '74. 2 grandchildren. Work - volunteering. Marriage in good health.    Social Determinants of Health   Financial Resource Strain: Low Risk    Difficulty of Paying Living Expenses: Not hard at all  Food Insecurity: No Food Insecurity   Worried About Charity fundraiser in the Last Year: Never true   [place] in the Last Year: Never true  Transportation Needs: No Transportation Needs   Lack of Transportation (Medical): No   Lack of Transportation (Non-Medical): No  Physical Activity: Sufficiently Active   Days of Exercise per Week: 7 days   Minutes of Exercise per Session: 60 min  Stress: No Stress Concern  Present   Feeling of Stress : Not at all  Social Connections: Moderately Isolated   Frequency of Communication with Friends and Family: More than three times a week   Frequency of Social Gatherings with Friends and Family: More than three times a week   Attends Religious Services: Never   Marine scientist or Organizations: No   Attends Music therapist: Never   Marital Status: Married  Human resources officer Violence: Not At Risk   Fear of Current or Ex-Partner: No   Emotionally Abused: No   Physically Abused: No   Sexually Abused: No    Family History  Problem Relation Age of Onset   Diverticulosis Father    Lung cancer Father        brain mets,did smoke   Hyperlipidemia Mother        died age 54   Thyroid disease Mother    Breast cancer Paternal Grandmother    Healthy Brother    Colon cancer Neg Hx      BP 122/82   Ht 5' 2.5" (1.588 m)   Wt 136 lb (61.7 kg)   BMI 24.48 kg/m   No flowsheet data found.  No flowsheet data found.  Review of Systems: See HPI above.     Objective:  Physical Exam:  Gen: NAD, comfortable in exam room  Bilateral feet/ankles: Mild collapse long arch.  Transverse arch collapse.  Healed scar right 1st MTP.  No other gross deformity, swelling, ecchymoses FROM ankles without pain. No TTP currently. NV intact distally.  Gait without abnormalities with orthotics in place.   Assessment & Plan:  1. 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  New custom orthotics made today.  Patient was fitted for a : standard, cushioned, semi-rigid orthotic. The orthotic was heated and afterward the patient stood on the orthotic blank positioned on the orthotic stand. The patient was positioned in subtalar neutral position and 10 degrees of ankle dorsiflexion in a weight bearing stance. After completion of molding, a stable base was applied to the orthotic blank. The blank was ground to a stable position for weight bearing. Size: 7 Base: blue med density eva Posting: 1st ray posts Additional orthotic padding: none

## 2020-08-20 ENCOUNTER — Encounter: Payer: Self-pay | Admitting: Internal Medicine

## 2020-10-15 ENCOUNTER — Encounter: Payer: PPO | Admitting: Internal Medicine

## 2020-10-20 ENCOUNTER — Encounter: Payer: Self-pay | Admitting: Internal Medicine

## 2020-11-17 ENCOUNTER — Other Ambulatory Visit: Payer: Self-pay

## 2020-11-17 DIAGNOSIS — H2513 Age-related nuclear cataract, bilateral: Secondary | ICD-10-CM | POA: Diagnosis not present

## 2020-11-17 DIAGNOSIS — H524 Presbyopia: Secondary | ICD-10-CM | POA: Diagnosis not present

## 2020-11-17 DIAGNOSIS — H52203 Unspecified astigmatism, bilateral: Secondary | ICD-10-CM | POA: Diagnosis not present

## 2020-11-17 DIAGNOSIS — H0011 Chalazion right upper eyelid: Secondary | ICD-10-CM | POA: Diagnosis not present

## 2020-11-17 NOTE — Progress Notes (Signed)
Chief Complaint  Patient presents with   Annual Exam     HPI: Patient  Cynthia Miller  75 y.o. comes in today for Preventive Health Care visit   Colon early November Eye  cataracts  eye style Mammogram  upcoming  Flu vaccine today  Hand arthritis is stable copes and stays active  Health Maintenance  Topic Date Due   COLONOSCOPY (Pts 45-72yrs Insurance coverage will need to be confirmed)  07/22/2020   COVID-19 Vaccine (5 - Booster for [place] series) 11/17/2020   MAMMOGRAM  03/28/2021   TETANUS/TDAP  10/08/2028   INFLUENZA VACCINE  Completed   DEXA SCAN  Completed   Hepatitis C Screening  Completed   Zoster Vaccines- Shingrix  Completed   HPV VACCINES  Aged Out   Health Maintenance Review LIFESTYLE:  Exercise:   active  3 miles   yoga.  Tobacco/ETS:  n Alcohol:  wine every night or every other low  Sugar beverages: n Sleep: about 7 hours  Drug use: no HH of   2   pet cat  Work: retired   ROS:  REST of 12 system review negative except as per HPI   Past Medical History:  Diagnosis Date   ALLERGIC RHINITIS    CIN I (cervical intraepithelial neoplasia I)    FIBROCYSTIC BREAST DISEASE    Hx of varicella    Kidney stones 07/08/2016   Low blood pressure    Osteoarthritis    THUMB   Osteopenia    Tinnitus of both ears    L>R, follows with ENT for same    Past Surgical History:  Procedure Laterality Date   BREAST BIOPSY Left    BREAST CYST ASPIRATION     BREAST CYST EXCISION Left    CERVICAL CONE BIOPSY     COLONOSCOPY     COLPOSCOPY     LAPAROSCOPIC TUBAL LIGATION     RIGHT FOOT     bednarz   TUBAL LIGATION      Family History  Problem Relation Age of Onset   Diverticulosis Father    Lung cancer Father        brain mets,did smoke   Hyperlipidemia Mother        died age 35   Thyroid disease Mother    Breast cancer Paternal Grandmother    Healthy Brother    Colon cancer Neg Hx     Social History   Socioeconomic History   Marital status:  Married    Spouse name: Not on file   Number of children: 1   Years of education: 16   Highest education level: Not on file  Occupational History   Occupation: Environmental education officer: RETIRED  Tobacco Use   Smoking status: Never   Smokeless tobacco: Never  Vaping Use   Vaping Use: Never used  Substance and Sexual Activity   Alcohol use: Yes    Alcohol/week: 14.0 standard drinks    Types: 14 Glasses of wine per week   Drug use: No   Sexual activity: Yes    Partners: Male    Birth control/protection: Surgical  Other Topics Concern   Not on file  Social History Narrative   HSG, [employer] in [place] to finish BA Art history. Married 1969 - 10 yr/divorce. Remarried '83 - 10 yrs/divorced. Remarried '97 -. 1 dtr - '74. 2 grandchildren. Work - volunteering. Marriage in good health.    Social Determinants of Health   Financial  Resource Strain: Low Risk    Difficulty of Paying Living Expenses: Not hard at all  Food Insecurity: No Food Insecurity   Worried About Charity fundraiser in the Last Year: Never true   Ran Out of Food in the Last Year: Never true  Transportation Needs: No Transportation Needs   Lack of Transportation (Medical): No   Lack of Transportation (Non-Medical): No  Physical Activity: Sufficiently Active   Days of Exercise per Week: 7 days   Minutes of Exercise per Session: 60 min  Stress: No Stress Concern Present   Feeling of Stress : Not at all  Social Connections: Moderately Isolated   Frequency of Communication with Friends and Family: More than three times a week   Frequency of Social Gatherings with Friends and Family: More than three times a week   Attends Religious Services: Never   Marine scientist or Organizations: No   Attends Archivist Meetings: Never   Marital Status: Married    Outpatient Medications Prior to Visit  Medication Sig Dispense Refill   COVID-19 mRNA Vac-TriS, Pfizer, (PFIZER-BIONT COVID-19  VAC-TRIS) SUSP injection Inject into the muscle. 0.3 mL 0   Multiple Vitamin (MULTIVITAMIN ADULT PO) Take by mouth.     levocetirizine (XYZAL) 5 MG tablet 1 tablet in the evening     montelukast (SINGULAIR) 10 MG tablet take 1 tablet once daily in the evening.     No facility-administered medications prior to visit.     EXAM:  BP 110/70 (BP Location: Left Arm, Patient Position: Sitting, Cuff Size: Normal)   Pulse 63   Temp 97.8 F (36.6 C) (Oral)   Ht 5' 2.5" (1.588 m)   Wt 138 lb 6.4 oz (62.8 kg)   SpO2 97%   BMI 24.91 kg/m   Body mass index is 24.91 kg/m. Wt Readings from Last 3 Encounters:  11/18/20 138 lb 6.4 oz (62.8 kg)  08/04/20 136 lb (61.7 kg)  10/09/19 138 lb (62.6 kg)    Physical Exam: Vital signs reviewed WNU:UVOZ is a well-developed well-nourished alert cooperative    who appearsr stated age in no acute distress.  HEENT: normocephalic atraumatic , Eyes: PERRL EOM's full, conjunctiva clear, , Ears: no deformity EAC's clear TMs with normal landmarks. Mouth: masked  NECK: supple without masses, thyromegaly or bruits. CHEST/PULM:  Clear to auscultation and percussion breath sounds equal no wheeze , rales or rhonchi. No chest wall deformities or tenderness. Breast: normal by inspection . No dimpling, discharge, masses, tenderness or discharge . CV: PMI is nondisplaced, S1 S2 no gallops, murmurs, rubs. Peripheral pulses are full without delay.No JVD .  ABDOMEN: Bowel sounds normal nontender  No guard or rebound, no hepato splenomegal no CVA tenderness.   Extremtities:  No clubbing cyanosis or edema, no acute joint swelling or redness no focal atrophy djd changes  left  hand and wrist  nl function NEURO:  Oriented x3, cranial nerves 3-12 appear to be intact, no obvious focal weakness,gait within normal limits no abnormal reflexes or asymmetrical SKIN: No acute rashes normal turgor, color, no bruising or petechiae. PSYCH: Oriented, good eye contact, no obvious depression  anxiety, cognition and judgment appear normal. LN: no cervical axillary inguinal adenopathy  Lab Results  Component Value Date   WBC 5.0 11/18/2020   HGB 14.1 11/18/2020   HCT 42.3 11/18/2020   PLT 201.0 11/18/2020   GLUCOSE 86 11/18/2020   CHOL 167 11/18/2020   TRIG 43.0 11/18/2020   HDL 65.30 11/18/2020  LDLCALC 93 11/18/2020   ALT 17 11/18/2020   AST 17 11/18/2020   NA 139 11/18/2020   K 3.6 11/18/2020   CL 104 11/18/2020   CREATININE 0.63 11/18/2020   BUN 14 11/18/2020   CO2 27 11/18/2020   TSH 2.02 11/18/2020   HGBA1C 5.6 07/22/2017    BP Readings from Last 3 Encounters:  11/18/20 110/70  08/04/20 122/82  10/09/19 126/74   Last dexa  July 2021    ASSESSMENT AND PLAN:  Discussed the following assessment and plan:    ICD-10-CM   1. Visit for preventive health examination  Z00.00     2. Medication management  P61.950 Basic metabolic panel    CBC with Differential/Platelet    TSH    Hepatic function panel    Lipid panel    Basic metabolic panel    CBC with Differential/Platelet    TSH    Hepatic function panel    Lipid panel    3. Osteoarthritis, unspecified osteoarthritis type, unspecified site  D32.67 Basic metabolic panel    CBC with Differential/Platelet    TSH    Hepatic function panel    Lipid panel    Basic metabolic panel    CBC with Differential/Platelet    TSH    Hepatic function panel    Lipid panel    4. Estrogen deficiency  T24.58 Basic metabolic panel    CBC with Differential/Platelet    TSH    Hepatic function panel    Lipid panel    Basic metabolic panel    CBC with Differential/Platelet    TSH    Hepatic function panel    Lipid panel    5. Elevated LDL cholesterol level  K99.83 Basic metabolic panel    CBC with Differential/Platelet    TSH    Hepatic function panel    Lipid panel    Basic metabolic panel    CBC with Differential/Platelet    TSH    Hepatic function panel    Lipid panel    6. Osteopenia, unspecified  location  J82.50 Basic metabolic panel    CBC with Differential/Platelet    TSH    Hepatic function panel    Lipid panel    Basic metabolic panel    CBC with Differential/Platelet    TSH    Hepatic function panel    Lipid panel    7. Need for immunization against influenza  Z23 Flu Vaccine QUAD High Dose(Fluad)    Healthy lifestyle  Reviewed hcm   Flu vaccine today   Return in about 1 year (around 11/18/2021).  Patient Care Team: Orma Cheetham, Standley Brooking, MD as PCP - General (Internal Medicine) Irene Shipper, MD (Gastroenterology) Jari Pigg, MD (Dermatology) Stefanie Libel, MD (Sports Medicine) Mosetta Anis, MD (Allergy) Leta Baptist, MD (Otolaryngology) Luberta Mutter, MD as Consulting Physician (Ophthalmology) Patient Instructions  Good to see you today. Continue lifestyle intervention healthy eating and exercise .   Proceed  with your colonoscopy screen and  mammogram  FU dexa  can be done 9 23 or later  .  Flu vaccine today .    Health Maintenance, Female Adopting a healthy lifestyle and getting preventive care are important in promoting health and wellness. Ask your health care provider about: The right schedule for you to have regular tests and exams. Things you can do on your own to prevent diseases and keep yourself healthy. What should I know about diet, weight, and exercise? Eat a healthy diet  Eat a diet that includes plenty of vegetables, fruits, low-fat dairy products, and lean protein. Do not eat a lot of foods that are high in solid fats, added sugars, or sodium. Maintain a healthy weight Body mass index (BMI) is used to identify weight problems. It estimates body fat based on height and weight. Your health care provider can help determine your BMI and help you achieve or maintain a healthy weight. Get regular exercise Get regular exercise. This is one of the most important things you can do for your health. Most adults should: Exercise for at least 150  minutes each week. The exercise should increase your heart rate and make you sweat (moderate-intensity exercise). Do strengthening exercises at least twice a week. This is in addition to the moderate-intensity exercise. Spend less time sitting. Even light physical activity can be beneficial. Watch cholesterol and blood lipids Have your blood tested for lipids and cholesterol at 75 years of age, then have this test every 5 years. Have your cholesterol levels checked more often if: Your lipid or cholesterol levels are high. You are older than 75 years of age. You are at high risk for heart disease. What should I know about cancer screening? Depending on your health history and family history, you may need to have cancer screening at various ages. This may include screening for: Breast cancer. Cervical cancer. Colorectal cancer. Skin cancer. Lung cancer. What should I know about heart disease, diabetes, and high blood pressure? Blood pressure and heart disease High blood pressure causes heart disease and increases the risk of stroke. This is more likely to develop in people who have high blood pressure readings, are of African descent, or are overweight. Have your blood pressure checked: Every 3-5 years if you are 43-45 years of age. Every year if you are 70 years old or older. Diabetes Have regular diabetes screenings. This checks your fasting blood sugar level. Have the screening done: Once every three years after age 91 if you are at a normal weight and have a low risk for diabetes. More often and at a younger age if you are overweight or have a high risk for diabetes. What should I know about preventing infection? Hepatitis B If you have a higher risk for hepatitis B, you should be screened for this virus. Talk with your health care provider to find out if you are at risk for hepatitis B infection. Hepatitis C Testing is recommended for: Everyone born from [birth year] through 1965. Anyone  with known risk factors for hepatitis C. Sexually transmitted infections (STIs) Get screened for STIs, including gonorrhea and chlamydia, if: You are sexually active and are younger than 75 years of age. You are older than 75 years of age and your health care provider tells you that you are at risk for this type of infection. Your sexual activity has changed since you were last screened, and you are at increased risk for chlamydia or gonorrhea. Ask your health care provider if you are at risk. Ask your health care provider about whether you are at high risk for HIV. Your health care provider may recommend a prescription medicine to help prevent HIV infection. If you choose to take medicine to prevent HIV, you should first get tested for HIV. You should then be tested every 3 months for as long as you are taking the medicine. Pregnancy If you are about to stop having your period (premenopausal) and you may become pregnant, seek counseling before you get pregnant. Take 400  to 800 micrograms (mcg) of folic acid every day if you become pregnant. Ask for birth control (contraception) if you want to prevent pregnancy. Osteoporosis and menopause Osteoporosis is a disease in which the bones lose minerals and strength with aging. This can result in bone fractures. If you are 68 years old or older, or if you are at risk for osteoporosis and fractures, ask your health care provider if you should: Be screened for bone loss. Take a calcium or vitamin D supplement to lower your risk of fractures. Be given hormone replacement therapy (HRT) to treat symptoms of menopause. Follow these instructions at home: Lifestyle Do not use any products that contain nicotine or tobacco, such as cigarettes, e-cigarettes, and chewing tobacco. If you need help quitting, ask your health care provider. Do not use street drugs. Do not share needles. Ask your health care provider for help if you need support or information about  quitting drugs. Alcohol use Do not drink alcohol if: Your health care provider tells you not to drink. You are pregnant, may be pregnant, or are planning to become pregnant. If you drink alcohol: Limit how much you use to 0-1 drink a day. Limit intake if you are breastfeeding. Be aware of how much alcohol is in your drink. In the [place] one drink equals one 12 oz bottle of beer (355 mL), one 5 oz glass of wine (148 mL), or one 1 oz glass of hard liquor (44 mL). General instructions Schedule regular health, dental, and eye exams. Stay current with your vaccines. Tell your health care provider if: You often feel depressed. You have ever been abused or do not feel safe at home. Summary Adopting a healthy lifestyle and getting preventive care are important in promoting health and wellness. Follow your health care provider's instructions about healthy diet, exercising, and getting tested or screened for diseases. Follow your health care provider's instructions on monitoring your cholesterol and blood pressure. This information is not intended to replace advice given to you by your health care provider. Make sure you discuss any questions you have with your health care provider. Document Revised: 04/18/2020 Document Reviewed: 02/01/2018 Elsevier Patient Education  2022 [place]. Jermery Caratachea M.D.

## 2020-11-18 ENCOUNTER — Encounter: Payer: Self-pay | Admitting: Internal Medicine

## 2020-11-18 ENCOUNTER — Ambulatory Visit (INDEPENDENT_AMBULATORY_CARE_PROVIDER_SITE_OTHER): Payer: PPO | Admitting: Internal Medicine

## 2020-11-18 ENCOUNTER — Ambulatory Visit: Payer: PPO

## 2020-11-18 VITALS — BP 110/70 | HR 63 | Temp 97.8°F | Ht 62.5 in | Wt 138.4 lb

## 2020-11-18 DIAGNOSIS — Z0000 Encounter for general adult medical examination without abnormal findings: Secondary | ICD-10-CM | POA: Diagnosis not present

## 2020-11-18 DIAGNOSIS — M8580 Other specified disorders of bone density and structure, unspecified site: Secondary | ICD-10-CM

## 2020-11-18 DIAGNOSIS — E2839 Other primary ovarian failure: Secondary | ICD-10-CM

## 2020-11-18 DIAGNOSIS — Z23 Encounter for immunization: Secondary | ICD-10-CM | POA: Diagnosis not present

## 2020-11-18 DIAGNOSIS — E7800 Pure hypercholesterolemia, unspecified: Secondary | ICD-10-CM

## 2020-11-18 DIAGNOSIS — M1990 Unspecified osteoarthritis, unspecified site: Secondary | ICD-10-CM

## 2020-11-18 DIAGNOSIS — Z79899 Other long term (current) drug therapy: Secondary | ICD-10-CM | POA: Diagnosis not present

## 2020-11-18 LAB — LIPID PANEL
Cholesterol: 167 mg/dL (ref 0–200)
HDL: 65.3 mg/dL (ref >39.00)
LDL Cholesterol: 93 mg/dL (ref 0–99)
NonHDL: 101.3
Total CHOL/HDL Ratio: 3
Triglycerides: 43 mg/dL (ref 0.0–149.0)
VLDL: 8.6 mg/dL (ref 0.0–40.0)

## 2020-11-18 LAB — BASIC METABOLIC PANEL
BUN: 14 mg/dL (ref 6–23)
CO2: 27 mEq/L (ref 19–32)
Calcium: 9.3 mg/dL (ref 8.4–10.5)
Chloride: 104 mEq/L (ref 96–112)
Creatinine, Ser: 0.63 mg/dL (ref 0.40–1.20)
GFR: 86.6 mL/min (ref >60.00)
Glucose, Bld: 86 mg/dL (ref 70–99)
Potassium: 3.6 mEq/L (ref 3.5–5.1)
Sodium: 139 mEq/L (ref 135–145)

## 2020-11-18 LAB — CBC WITH DIFFERENTIAL/PLATELET
Basophils Absolute: 0 10*3/uL (ref 0.0–0.1)
Basophils Relative: 0.6 % (ref 0.0–3.0)
Eosinophils Absolute: 0.1 10*3/uL (ref 0.0–0.7)
Eosinophils Relative: 1.2 % (ref 0.0–5.0)
HCT: 42.3 % (ref 36.0–46.0)
Hemoglobin: 14.1 g/dL (ref 12.0–15.0)
Lymphocytes Relative: 27.7 % (ref 12.0–46.0)
Lymphs Abs: 1.4 10*3/uL (ref 0.7–4.0)
MCHC: 33.2 g/dL (ref 30.0–36.0)
MCV: 92.1 fl (ref 78.0–100.0)
Monocytes Absolute: 0.4 10*3/uL (ref 0.1–1.0)
Monocytes Relative: 8.1 % (ref 3.0–12.0)
Neutro Abs: 3.1 10*3/uL (ref 1.4–7.7)
Neutrophils Relative %: 62.4 % (ref 43.0–77.0)
Platelets: 201 10*3/uL (ref 150.0–400.0)
RBC: 4.6 Mil/uL (ref 3.87–5.11)
RDW: 14 % (ref 11.5–15.5)
WBC: 5 10*3/uL (ref 4.0–10.5)

## 2020-11-18 LAB — HEPATIC FUNCTION PANEL
ALT: 17 U/L (ref 0–35)
AST: 17 U/L (ref 0–37)
Albumin: 4.2 g/dL (ref 3.5–5.2)
Alkaline Phosphatase: 69 U/L (ref 39–117)
Bilirubin, Direct: 0.1 mg/dL (ref 0.0–0.3)
Total Bilirubin: 0.6 mg/dL (ref 0.2–1.2)
Total Protein: 7 g/dL (ref 6.0–8.3)

## 2020-11-18 LAB — TSH: TSH: 2.02 u[IU]/mL (ref 0.35–5.50)

## 2020-11-18 NOTE — Patient Instructions (Addendum)
Good to see you today. Continue lifestyle intervention healthy eating and exercise .   Proceed  with your colonoscopy screen and  mammogram  FU dexa  can be done 9 23 or later  .  Flu vaccine today .    Health Maintenance, Female Adopting a healthy lifestyle and getting preventive care are important in promoting health and wellness. Ask your health care provider about: The right schedule for you to have regular tests and exams. Things you can do on your own to prevent diseases and keep yourself healthy. What should I know about diet, weight, and exercise? Eat a healthy diet  Eat a diet that includes plenty of vegetables, fruits, low-fat dairy products, and lean protein. Do not eat a lot of foods that are high in solid fats, added sugars, or sodium. Maintain a healthy weight Body mass index (BMI) is used to identify weight problems. It estimates body fat based on height and weight. Your health care provider can help determine your BMI and help you achieve or maintain a healthy weight. Get regular exercise Get regular exercise. This is one of the most important things you can do for your health. Most adults should: Exercise for at least 150 minutes each week. The exercise should increase your heart rate and make you sweat (moderate-intensity exercise). Do strengthening exercises at least twice a week. This is in addition to the moderate-intensity exercise. Spend less time sitting. Even light physical activity can be beneficial. Watch cholesterol and blood lipids Have your blood tested for lipids and cholesterol at 75 years of age, then have this test every 5 years. Have your cholesterol levels checked more often if: Your lipid or cholesterol levels are high. You are older than 75 years of age. You are at high risk for heart disease. What should I know about cancer screening? Depending on your health history and family history, you may need to have cancer screening at various ages. This  may include screening for: Breast cancer. Cervical cancer. Colorectal cancer. Skin cancer. Lung cancer. What should I know about heart disease, diabetes, and high blood pressure? Blood pressure and heart disease High blood pressure causes heart disease and increases the risk of stroke. This is more likely to develop in people who have high blood pressure readings, are of African descent, or are overweight. Have your blood pressure checked: Every 3-5 years if you are 35-24 years of age. Every year if you are 50 years old or older. Diabetes Have regular diabetes screenings. This checks your fasting blood sugar level. Have the screening done: Once every three years after age 19 if you are at a normal weight and have a low risk for diabetes. More often and at a younger age if you are overweight or have a high risk for diabetes. What should I know about preventing infection? Hepatitis B If you have a higher risk for hepatitis B, you should be screened for this virus. Talk with your health care provider to find out if you are at risk for hepatitis B infection. Hepatitis C Testing is recommended for: Everyone born from [birth year] through 1965. Anyone with known risk factors for hepatitis C. Sexually transmitted infections (STIs) Get screened for STIs, including gonorrhea and chlamydia, if: You are sexually active and are younger than 75 years of age. You are older than 75 years of age and your health care provider tells you that you are at risk for this type of infection. Your sexual activity has changed since you were  last screened, and you are at increased risk for chlamydia or gonorrhea. Ask your health care provider if you are at risk. Ask your health care provider about whether you are at high risk for HIV. Your health care provider may recommend a prescription medicine to help prevent HIV infection. If you choose to take medicine to prevent HIV, you should first get tested for HIV. You should  then be tested every 3 months for as long as you are taking the medicine. Pregnancy If you are about to stop having your period (premenopausal) and you may become pregnant, seek counseling before you get pregnant. Take 400 to 800 micrograms (mcg) of folic acid every day if you become pregnant. Ask for birth control (contraception) if you want to prevent pregnancy. Osteoporosis and menopause Osteoporosis is a disease in which the bones lose minerals and strength with aging. This can result in bone fractures. If you are 60 years old or older, or if you are at risk for osteoporosis and fractures, ask your health care provider if you should: Be screened for bone loss. Take a calcium or vitamin D supplement to lower your risk of fractures. Be given hormone replacement therapy (HRT) to treat symptoms of menopause. Follow these instructions at home: Lifestyle Do not use any products that contain nicotine or tobacco, such as cigarettes, e-cigarettes, and chewing tobacco. If you need help quitting, ask your health care provider. Do not use street drugs. Do not share needles. Ask your health care provider for help if you need support or information about quitting drugs. Alcohol use Do not drink alcohol if: Your health care provider tells you not to drink. You are pregnant, may be pregnant, or are planning to become pregnant. If you drink alcohol: Limit how much you use to 0-1 drink a day. Limit intake if you are breastfeeding. Be aware of how much alcohol is in your drink. In the [place] one drink equals one 12 oz bottle of beer (355 mL), one 5 oz glass of wine (148 mL), or one 1 oz glass of hard liquor (44 mL). General instructions Schedule regular health, dental, and eye exams. Stay current with your vaccines. Tell your health care provider if: You often feel depressed. You have ever been abused or do not feel safe at home. Summary Adopting a healthy lifestyle and getting preventive care are  important in promoting health and wellness. Follow your health care provider's instructions about healthy diet, exercising, and getting tested or screened for diseases. Follow your health care provider's instructions on monitoring your cholesterol and blood pressure. This information is not intended to replace advice given to you by your health care provider. Make sure you discuss any questions you have with your health care provider. Document Revised: 04/18/2020 Document Reviewed: 02/01/2018 Elsevier Patient Education  2022 [employer].

## 2020-11-18 NOTE — Progress Notes (Signed)
Lab results are all within normal limits and favorable Continue your healthy lifestyle and activity. See you in a year.

## 2020-12-11 ENCOUNTER — Ambulatory Visit (AMBULATORY_SURGERY_CENTER): Payer: PPO | Admitting: *Deleted

## 2020-12-11 ENCOUNTER — Other Ambulatory Visit: Payer: Self-pay

## 2020-12-11 VITALS — Ht 62.5 in | Wt 135.0 lb

## 2020-12-11 DIAGNOSIS — Z1211 Encounter for screening for malignant neoplasm of colon: Secondary | ICD-10-CM

## 2020-12-11 MED ORDER — SUTAB 1479-225-188 MG PO TABS: 24.0000 | ORAL_TABLET | ORAL | 0 refills | Status: DC

## 2020-12-11 NOTE — Progress Notes (Signed)
No egg or soy allergy known to patient  No issues known to pt with past sedation with any surgeries or procedures Patient denies ever being told they had issues or difficulty with intubation  No FH of Malignant Hyperthermia Pt is not on diet pills Pt is not on  home 02  Pt is not on blood thinners  Pt denies issues with constipation  No A fib or A flutter  Pt is fully vaccinated  for [employer] given to pt in PV today , Code to Pharmacy and  NO PA's for preps discussed with pt In PV today  Discussed with pt there will be an out-of-pocket cost for prep and that varies from $0 to 70 +  dollars - pt verbalized understanding   Due to the COVID-19 pandemic we are asking patients to follow certain guidelines in PV and the [facility]   Pt aware of COVID protocols and [facility] guidelines   Pt verified name, DOB, address and insurance during PV today.  Pt mailed instruction packet of Emmi video, copy of consent form to read and not return, and instructions. [employer]  coupon mailed in packet. PV completed over the phone.  Pt encouraged to call with questions or issues.  My Chart instructions to pt as well

## 2020-12-25 ENCOUNTER — Encounter: Payer: Self-pay | Admitting: Internal Medicine

## 2020-12-25 ENCOUNTER — Ambulatory Visit (AMBULATORY_SURGERY_CENTER): Payer: PPO | Admitting: Internal Medicine

## 2020-12-25 VITALS — BP 125/66 | HR 78 | Temp 97.6°F | Resp 12 | Ht 62.5 in | Wt 135.0 lb

## 2020-12-25 DIAGNOSIS — Z1211 Encounter for screening for malignant neoplasm of colon: Secondary | ICD-10-CM | POA: Diagnosis not present

## 2020-12-25 MED ORDER — SODIUM CHLORIDE 0.9 % IV SOLN: 500.0000 mL | Freq: Once | INTRAVENOUS | Status: DC

## 2020-12-25 NOTE — Progress Notes (Signed)
Pt's states no medical or surgical changes since previsit or office visit. VS assessed by C.W 

## 2020-12-25 NOTE — Op Note (Signed)
[name]: Cynthia Miller Procedure Date: 12/25/2020 2:14 PM MRN: [id number] Endoscopist: Docia Chuck. Henrene Pastor , MD Age: 75 Referring MD:  Date of Birth: [birth year] Gender: Female Account #: [id number] Procedure:                Colonoscopy Indications:              Screening for colorectal malignant neoplasm.                            Previous examinations 2002 and 2012 were negative                            for neoplasia Medicines:                Monitored Anesthesia Care Procedure:                Pre-Anesthesia Assessment:                           - Prior to the procedure, a History and Physical                            was performed, and patient medications and                            allergies were reviewed. The patient's tolerance of                            previous anesthesia was also reviewed. The risks                            and benefits of the procedure and the sedation                            options and risks were discussed with the patient.                            All questions were answered, and informed consent                            was obtained. Prior Anticoagulants: The patient has                            taken no previous anticoagulant or antiplatelet                            agents. ASA Grade Assessment: II - A patient with                            mild systemic disease. After reviewing the risks                            and benefits, the patient was deemed in  satisfactory condition to undergo the procedure.                           After obtaining informed consent, the colonoscope                            was passed under direct vision. Throughout the                            procedure, the patient's blood pressure, pulse, and                            oxygen saturations were monitored continuously. The                            0405 PCF-H190TL Slim SB Colonoscope was introduced                             through the anus and advanced to the the cecum,                            identified by appendiceal orifice and ileocecal                            valve. The ileocecal valve, appendiceal orifice,                            and rectum were photographed. The quality of the                            bowel preparation was excellent. The colonoscopy                            was performed without difficulty. The patient                            tolerated the procedure well. The bowel preparation                            used was SUPREP/tablets via split dose instruction. Scope In: 2:30:27 PM Scope Out: 2:50:02 PM Scope Withdrawal Time: 0 hours 10 minutes 35 seconds  Total Procedure Duration: 0 hours 19 minutes 35 seconds  Findings:                 The entire examined colon appeared normal on direct                            and retroflexion views. Complications:            No immediate complications. Estimated blood loss:                            None. Estimated Blood Loss:     Estimated blood loss: none. Impression:               -  The entire examined colon is normal on direct and                            retroflexion views.                           - No specimens collected. Recommendation:           - Repeat colonoscopy is not recommended for                            surveillance.                           - Patient has a contact number available for                            emergencies. The signs and symptoms of potential                            delayed complications were discussed with the                            patient. Return to normal activities tomorrow.                            Written discharge instructions were provided to the                            patient.                           - Resume previous diet.                           - Continue present medications. Docia Chuck. Henrene Pastor, MD 12/25/2020 2:54:19 PM This report has  been signed electronically.

## 2020-12-25 NOTE — Progress Notes (Signed)
HISTORY OF PRESENT ILLNESS:  Cynthia Miller is a 75 y.o. female who presents for screening colonoscopy.  Prior colonoscopic examinations 2002 and 2012 were negative for neoplasia.  No active complaints  REVIEW OF SYSTEMS:  All non-GI ROS negative.  Past Medical History:  Diagnosis Date   ALLERGIC RHINITIS    seasonal   Allergy    Cataract    forming   CIN I (cervical intraepithelial neoplasia I)    FIBROCYSTIC BREAST DISEASE    Hx of varicella    as child   Kidney stones 07/08/2016   Low blood pressure    Osteoarthritis    THUMB   Osteopenia    left  hip   Tinnitus of both ears    L>R, follows with ENT for same- past hx    Past Surgical History:  Procedure Laterality Date   BREAST BIOPSY Left    BREAST CYST ASPIRATION     BREAST CYST EXCISION Left    CERVICAL CONE BIOPSY     COLONOSCOPY     x2- one brodie, one Mileydi Milsap   COLPOSCOPY     LAPAROSCOPIC TUBAL LIGATION     RIGHT FOOT     bednarz- bunionctomy    Social History Cynthia Miller  reports that she has never smoked. She has never used smokeless tobacco. She reports current alcohol use of about 14.0 standard drinks per week. She reports that she does not use drugs.  family history includes Breast cancer in her paternal grandmother; Diverticulosis in her father; Healthy in her brother; Hyperlipidemia in her mother; Lung cancer in her father; Thyroid disease in her mother.  Allergies  Allergen Reactions   Penicillins     REACTION: Hives       PHYSICAL EXAMINATION:  Vital signs: BP (!) 142/63   Pulse 68   Temp 97.6 F (36.4 C) (Skin)   Ht 5' 2.5" (1.588 m)   Wt 135 lb (61.2 kg)   SpO2 99%   BMI 24.30 kg/m  General: Well-developed, well-nourished, no acute distress HEENT: Sclerae are anicteric, conjunctiva pink. Oral mucosa intact Lungs: Clear Heart: Regular Abdomen: soft, nontender, nondistended, no obvious ascites, no peritoneal signs, normal bowel sounds. No organomegaly. Extremities: No  edema Psychiatric: alert and oriented x3. Cooperative     ASSESSMENT:  1.  Colon cancer screening.  Average risk   PLAN:   1.  Screening colonoscopy

## 2020-12-25 NOTE — Progress Notes (Signed)
Report to PACU, RN, vss, BBS= Clear.  

## 2020-12-25 NOTE — Patient Instructions (Signed)
Impression/Recommendations:  Resume previous diet. Continue present medications.  Repeat colonoscopy not recommended for surveillance.  YOU HAD AN ENDOSCOPIC PROCEDURE TODAY AT [facility]:   Refer to the procedure report that was given to you for any specific questions about what was found during the examination.  If the procedure report does not answer your questions, please call your gastroenterologist to clarify.  If you requested that your care partner not be given the details of your procedure findings, then the procedure report has been included in a sealed envelope for you to review at your convenience later.  YOU SHOULD EXPECT: Some feelings of bloating in the abdomen. Passage of more gas than usual.  Walking can help get rid of the air that was put into your GI tract during the procedure and reduce the bloating. If you had a lower endoscopy (such as a colonoscopy or flexible sigmoidoscopy) you may notice spotting of blood in your stool or on the toilet paper. If you underwent a bowel prep for your procedure, you may not have a normal bowel movement for a few days.  Please Note:  You might notice some irritation and congestion in your nose or some drainage.  This is from the oxygen used during your procedure.  There is no need for concern and it should clear up in a day or so.  SYMPTOMS TO REPORT IMMEDIATELY:  Following lower endoscopy (colonoscopy or flexible sigmoidoscopy):  Excessive amounts of blood in the stool  Significant tenderness or worsening of abdominal pains  Swelling of the abdomen that is new, acute  Fever of 100F or higher  For urgent or emergent issues, a gastroenterologist can be reached at any hour by calling [phone]. Do not use MyChart messaging for urgent concerns.    DIET:  We do recommend a small meal at first, but then you may proceed to your regular diet.  Drink plenty of fluids but you should avoid alcoholic beverages for 24  hours.  ACTIVITY:  You should plan to take it easy for the rest of today and you should NOT DRIVE or use heavy machinery until tomorrow (because of the sedation medicines used during the test).    FOLLOW UP: Our staff will call the number listed on your records 48-72 hours following your procedure to check on you and address any questions or concerns that you may have regarding the information given to you following your procedure. If we do not reach you, we will leave a message.  We will attempt to reach you two times.  During this call, we will ask if you have developed any symptoms of COVID 19. If you develop any symptoms (ie: fever, flu-like symptoms, shortness of breath, cough etc.) before then, please call [phone].  If you test positive for Covid 19 in the 2 weeks post procedure, please call and report this information to [place].    If any biopsies were taken you will be contacted by phone or by letter within the next 1-3 weeks.  Please call us at [phone] if you have not heard about the biopsies in 3 weeks.    SIGNATURES/CONFIDENTIALITY: You and/or your care partner have signed paperwork which will be entered into your electronic medical record.  These signatures attest to the fact that that the information above on your After Visit Summary has been reviewed and is understood.  Full responsibility of the confidentiality of this discharge information lies with you and/or your care-partner.

## 2020-12-29 ENCOUNTER — Telehealth: Payer: Self-pay

## 2020-12-29 NOTE — Telephone Encounter (Signed)
  Follow up Call-  Call back number 12/25/2020  Post procedure Call Back phone  # [phone]  Permission to leave phone message Yes  Some recent data might be hidden     Patient questions:  Do you have a fever, pain , or abdominal swelling? No. Pain Score  0 *  Have you tolerated food without any problems? Yes.    Have you been able to return to your normal activities? Yes.    Do you have any questions about your discharge instructions: Diet   No. Medications  No. Follow up visit  No.  Do you have questions or concerns about your Care? No.  Actions: * If pain score is 4 or above: No action needed, pain <4.  Have you developed a fever since your procedure? no  2.   Have you had an respiratory symptoms (SOB or cough) since your procedure? no  3.   Have you tested positive for COVID 19 since your procedure no  4.   Have you had any family members/close contacts diagnosed with the COVID 19 since your procedure?  no   If yes to any of these questions please route to Joylene John, RN and Joella Prince, RN

## 2021-01-05 ENCOUNTER — Telehealth: Payer: Self-pay | Admitting: Internal Medicine

## 2021-01-05 ENCOUNTER — Other Ambulatory Visit: Payer: Self-pay | Admitting: Internal Medicine

## 2021-01-05 DIAGNOSIS — Z1231 Encounter for screening mammogram for malignant neoplasm of breast: Secondary | ICD-10-CM

## 2021-01-05 NOTE — Telephone Encounter (Signed)
Spoke with patient to schedule Medicare Annual Wellness Visit (AWV) either virtually or in office.   Declined wanted call [phone]  Last AWV 01/09/20 ; please schedule at anytime with [facility] Nurse Health Advisor 1 or 2   This should be a 45 minute visit.

## 2021-03-02 ENCOUNTER — Telehealth: Payer: Self-pay | Admitting: Internal Medicine

## 2021-03-02 NOTE — Telephone Encounter (Signed)
Spoke with patient to schedule Medicare Annual Wellness Visit (AWV) either virtually or in office.  Patient declined stating she didn't think she needed AWV at this time.    Last AWVs 01/09/20

## 2021-03-17 DIAGNOSIS — Z23 Encounter for immunization: Secondary | ICD-10-CM | POA: Diagnosis not present

## 2021-03-17 DIAGNOSIS — D2261 Melanocytic nevi of right upper limb, including shoulder: Secondary | ICD-10-CM | POA: Diagnosis not present

## 2021-03-17 DIAGNOSIS — L821 Other seborrheic keratosis: Secondary | ICD-10-CM | POA: Diagnosis not present

## 2021-03-17 DIAGNOSIS — Z411 Encounter for cosmetic surgery: Secondary | ICD-10-CM | POA: Diagnosis not present

## 2021-03-17 DIAGNOSIS — D2272 Melanocytic nevi of left lower limb, including hip: Secondary | ICD-10-CM | POA: Diagnosis not present

## 2021-03-17 DIAGNOSIS — Z86018 Personal history of other benign neoplasm: Secondary | ICD-10-CM | POA: Diagnosis not present

## 2021-03-17 DIAGNOSIS — D2372 Other benign neoplasm of skin of left lower limb, including hip: Secondary | ICD-10-CM | POA: Diagnosis not present

## 2021-03-17 DIAGNOSIS — L578 Other skin changes due to chronic exposure to nonionizing radiation: Secondary | ICD-10-CM | POA: Diagnosis not present

## 2021-03-17 DIAGNOSIS — D225 Melanocytic nevi of trunk: Secondary | ICD-10-CM | POA: Diagnosis not present

## 2021-03-17 DIAGNOSIS — L570 Actinic keratosis: Secondary | ICD-10-CM | POA: Diagnosis not present

## 2021-03-31 ENCOUNTER — Ambulatory Visit
Admission: RE | Admit: 2021-03-31 | Discharge: 2021-03-31 | Disposition: A | Discharge status: Home / Self Care | Payer: PPO | Source: Ambulatory Visit | Attending: Internal Medicine | Admitting: Internal Medicine

## 2021-03-31 DIAGNOSIS — Z1231 Encounter for screening mammogram for malignant neoplasm of breast: Secondary | ICD-10-CM

## 2021-04-01 DIAGNOSIS — J3089 Other allergic rhinitis: Secondary | ICD-10-CM | POA: Diagnosis not present

## 2021-04-16 ENCOUNTER — Ambulatory Visit: Payer: PPO

## 2021-11-17 ENCOUNTER — Other Ambulatory Visit: Payer: PPO

## 2021-11-18 DIAGNOSIS — H01002 Unspecified blepharitis right lower eyelid: Secondary | ICD-10-CM | POA: Diagnosis not present

## 2021-11-18 DIAGNOSIS — H2513 Age-related nuclear cataract, bilateral: Secondary | ICD-10-CM | POA: Diagnosis not present

## 2021-11-18 DIAGNOSIS — H01005 Unspecified blepharitis left lower eyelid: Secondary | ICD-10-CM | POA: Diagnosis not present

## 2021-11-23 ENCOUNTER — Encounter: Payer: PPO | Admitting: Internal Medicine

## 2021-12-15 ENCOUNTER — Telehealth: Payer: Self-pay

## 2021-12-16 ENCOUNTER — Encounter: Payer: Self-pay | Admitting: Internal Medicine

## 2021-12-16 ENCOUNTER — Ambulatory Visit (INDEPENDENT_AMBULATORY_CARE_PROVIDER_SITE_OTHER): Payer: PPO | Admitting: Internal Medicine

## 2021-12-16 VITALS — BP 138/84 | HR 72 | Temp 97.9°F | Ht 61.75 in | Wt 133.6 lb

## 2021-12-16 DIAGNOSIS — M8580 Other specified disorders of bone density and structure, unspecified site: Secondary | ICD-10-CM

## 2021-12-16 DIAGNOSIS — Z23 Encounter for immunization: Secondary | ICD-10-CM | POA: Diagnosis not present

## 2021-12-16 DIAGNOSIS — E7800 Pure hypercholesterolemia, unspecified: Secondary | ICD-10-CM | POA: Diagnosis not present

## 2021-12-16 DIAGNOSIS — Z79899 Other long term (current) drug therapy: Secondary | ICD-10-CM

## 2021-12-16 DIAGNOSIS — Z0000 Encounter for general adult medical examination without abnormal findings: Secondary | ICD-10-CM | POA: Diagnosis not present

## 2021-12-16 NOTE — Patient Instructions (Addendum)
Good to see  you today  Continue lifestyle intervention healthy eating and exercise .   Weight bearing exercise for bone health .  Get fasting lab appt and I will place orders .

## 2021-12-16 NOTE — Progress Notes (Signed)
No chief complaint on file.   HPI: Patient  Cynthia Miller  76 y.o. comes in today for Preventive Health Care visit   Yearly eye check  good mccuen.    Health Maintenance  Topic Date Due   COVID-19 Vaccine (4 - Pfizer risk series) 09/11/2020   Medicare Annual Wellness (AWV)  02/07/2021   INFLUENZA VACCINE  09/22/2021   MAMMOGRAM  03/31/2022   TETANUS/TDAP  10/08/2028   Pneumonia Vaccine 77+ Years old  Completed   DEXA SCAN  Completed   Hepatitis C Screening  Completed   Zoster Vaccines- Shingrix  Completed   HPV VACCINES  Aged Out   COLONOSCOPY (Pts 45-30yr Insurance coverage will need to be confirmed)  Discontinued   Health Maintenance Review LIFESTYLE:  Exercise:  walks  and yoga   Tobacco/ETS:n Alcohol: ocass not often  Sugar beverages  organic  green tea  honey  water  Sleep: wakening   and reads.  Up at 750 Drug use: no HH of  2  1 cat   ROS:  REST of 12 system review negative except as per HPI   Past Medical History:  Diagnosis Date   ALLERGIC RHINITIS    seasonal   Allergy    Cataract    forming   CIN I (cervical intraepithelial neoplasia I)    FIBROCYSTIC BREAST DISEASE    Hx of varicella    as child   Kidney stones 07/08/2016   Low blood pressure    Osteoarthritis    THUMB   Osteopenia    left  hip   Tinnitus of both ears    L>R, follows with ENT for same- past hx    Past Surgical History:  Procedure Laterality Date   BREAST BIOPSY Left    BREAST CYST ASPIRATION     BREAST CYST EXCISION Left    CERVICAL CONE BIOPSY     COLONOSCOPY     x2- one brodie, one perry   COLPOSCOPY     LAPAROSCOPIC TUBAL LIGATION     RIGHT FOOT     bednarz- bunionctomy    Family History  Problem Relation Age of Onset   Hyperlipidemia Mother        died age 76  Thyroid disease Mother    Diverticulosis Father    Lung cancer Father        brain mets,did smoke   Healthy Brother    Breast cancer Paternal Grandmother    Colon cancer Neg Hx    Colon  polyps Neg Hx    Esophageal cancer Neg Hx    Rectal cancer Neg Hx    Stomach cancer Neg Hx     Social History   Socioeconomic History   Marital status: Married    Spouse name: Not on file   Number of children: 1   Years of education: 16   Highest education level: Not on file  Occupational History   Occupation: vEnvironmental education officer RETIRED  Tobacco Use   Smoking status: Never   Smokeless tobacco: Never  Vaping Use   Vaping Use: Never used  Substance and Sexual Activity   Alcohol use: Yes    Alcohol/week: 14.0 standard drinks of alcohol    Types: 14 Glasses of wine per week   Drug use: No   Sexual activity: Yes    Partners: Male    Birth control/protection: Surgical  Other Topics Concern   Not on file  Social History Narrative  HSG, [employer] in [place] to finish BA Art history. Married 1969 - 10 yr/divorce. Remarried '83 - 10 yrs/divorced. Remarried '97 -. 1 dtr - '74. 2 grandchildren. Work - volunteering. Marriage in good health.    Social Determinants of Health   Financial Resource Strain: Low Risk  (01/09/2020)   Overall Financial Resource Strain (CARDIA)    Difficulty of Paying Living Expenses: Not hard at all  Food Insecurity: No Food Insecurity (01/09/2020)   Hunger Vital Sign    Worried About Running Out of Food in the Last Year: Never true    Ran Out of Food in the Last Year: Never true  Transportation Needs: No Transportation Needs (01/09/2020)   PRAPARE - Hydrologist (Medical): No    Lack of Transportation (Non-Medical): No  Physical Activity: Sufficiently Active (01/09/2020)   Exercise Vital Sign    Days of Exercise per Week: 7 days    Minutes of Exercise per Session: 60 min  Stress: No Stress Concern Present (01/09/2020)   [place]    Feeling of Stress : Not at all  Social Connections: Moderately Isolated (01/09/2020)   Social  Connection and Isolation Panel [NHANES]    Frequency of Communication with Friends and Family: More than three times a week    Frequency of Social Gatherings with Friends and Family: More than three times a week    Attends Religious Services: Never    Marine scientist or Organizations: No    Attends Archivist Meetings: Never    Marital Status: Married    Outpatient Medications Prior to Visit  Medication Sig Dispense Refill   COVID-19 mRNA Vac-TriS, Pfizer, (PFIZER-BIONT COVID-19 VAC-TRIS) SUSP injection Inject into the muscle. 0.3 mL 0   Misc Natural Products (ELDERBERRY IMMUNE COMPLEX) CHEW Chew by mouth. (Patient not taking: Reported on 12/25/2020)     Multiple Vitamin (MULTIVITAMIN ADULT PO) Take by mouth. (Patient not taking: Reported on 12/25/2020)     tretinoin (RETIN-A) 0.05 % cream SMARTSIG:Topical Every Evening     No facility-administered medications prior to visit.     EXAM:  There were no vitals taken for this visit.  There is no height or weight on file to calculate BMI. Wt Readings from Last 3 Encounters:  12/25/20 135 lb (61.2 kg)  12/11/20 135 lb (61.2 kg)  11/18/20 138 lb 6.4 oz (62.8 kg)    Physical Exam: Vital signs reviewed SHF:WYOV is a well-developed well-nourished alert cooperative    who appearsr stated age in no acute distress.  HEENT: normocephalic atraumatic , Eyes: PERRL EOM's full, conjunctiva clear, Nares: paten,t no deformity discharge or tenderness., Ears: no deformity EAC's clear TMs with normal landmarks. Mouth: clear OP, no lesions, edema.  Moist mucous membranes. Dentition in adequate repair. NECK: supple without masses, thyromegaly or bruits. CHEST/PULM:  Clear to auscultation and percussion breath sounds equal no wheeze , rales or rhonchi. No chest wall deformities or tenderness. Breast: normal by inspection . No dimpling, discharge, masses, tenderness or discharge . CV: PMI is nondisplaced, S1 S2 no gallops, murmurs, rubs.  Peripheral pulses are full without delay.No JVD .  ABDOMEN: Bowel sounds normal nontender  No guard or rebound, no hepato splenomegal no CVA tenderness.  No hernia. Extremtities:  No clubbing cyanosis or edema, no acute joint swelling or redness no focal atrophy NEURO:  Oriented x3, cranial nerves 3-12 appear to be intact, no obvious focal weakness,gait  within normal limits no abnormal reflexes or asymmetrical SKIN: No acute rashes normal turgor, color, no bruising or petechiae. PSYCH: Oriented, good eye contact, no obvious depression anxiety, cognition and judgment appear normal. LN: no cervical axillary inguinal adenopathy  Lab Results  Component Value Date   WBC 5.0 11/18/2020   HGB 14.1 11/18/2020   HCT 42.3 11/18/2020   PLT 201.0 11/18/2020   GLUCOSE 86 11/18/2020   CHOL 167 11/18/2020   TRIG 43.0 11/18/2020   HDL 65.30 11/18/2020   LDLCALC 93 11/18/2020   ALT 17 11/18/2020   AST 17 11/18/2020   NA 139 11/18/2020   K 3.6 11/18/2020   CL 104 11/18/2020   CREATININE 0.63 11/18/2020   BUN 14 11/18/2020   CO2 27 11/18/2020   TSH 2.02 11/18/2020   HGBA1C 5.6 07/22/2017    BP Readings from Last 3 Encounters:  12/25/20 125/66  11/18/20 110/70  08/04/20 122/82    Lab plan reviewed with patient   ASSESSMENT AND PLAN:  Discussed the following assessment and plan:    ICD-10-CM   1. Visit for preventive health examination  Z00.00     2. Osteopenia, unspecified location  M85.80      No follow-ups on file.  Patient Care Team: Kainat Pizana, Standley Brooking, MD as PCP - General (Internal Medicine) Irene Shipper, MD (Gastroenterology) Jari Pigg, MD (Dermatology) Stefanie Libel, MD (Sports Medicine) Mosetta Anis, MD (Allergy) Leta Baptist, MD (Otolaryngology) Luberta Mutter, MD as Consulting Physician (Ophthalmology) There are no Patient Instructions on file for this visit.  Standley Brooking. Aziya Arena M.D.

## 2021-12-17 ENCOUNTER — Other Ambulatory Visit (INDEPENDENT_AMBULATORY_CARE_PROVIDER_SITE_OTHER): Payer: PPO

## 2021-12-17 DIAGNOSIS — E7800 Pure hypercholesterolemia, unspecified: Secondary | ICD-10-CM

## 2021-12-17 DIAGNOSIS — Z23 Encounter for immunization: Secondary | ICD-10-CM

## 2021-12-17 DIAGNOSIS — Z79899 Other long term (current) drug therapy: Secondary | ICD-10-CM | POA: Diagnosis not present

## 2021-12-17 DIAGNOSIS — M8580 Other specified disorders of bone density and structure, unspecified site: Secondary | ICD-10-CM

## 2021-12-17 LAB — LIPID PANEL
Cholesterol: 166 mg/dL (ref 0–200)
HDL: 62 mg/dL (ref >39.00)
LDL Cholesterol: 96 mg/dL (ref 0–99)
NonHDL: 103.57
Total CHOL/HDL Ratio: 3
Triglycerides: 36 mg/dL (ref 0.0–149.0)
VLDL: 7.2 mg/dL (ref 0.0–40.0)

## 2021-12-17 LAB — BASIC METABOLIC PANEL
BUN: 25 mg/dL — ABNORMAL HIGH (ref 6–23)
CO2: 29 mEq/L (ref 19–32)
Calcium: 9.6 mg/dL (ref 8.4–10.5)
Chloride: 104 mEq/L (ref 96–112)
Creatinine, Ser: 0.76 mg/dL (ref 0.40–1.20)
GFR: 75.92 mL/min (ref >60.00)
Glucose, Bld: 101 mg/dL — ABNORMAL HIGH (ref 70–99)
Potassium: 4.5 mEq/L (ref 3.5–5.1)
Sodium: 139 mEq/L (ref 135–145)

## 2021-12-17 LAB — CBC WITH DIFFERENTIAL/PLATELET
Basophils Absolute: 0 10*3/uL (ref 0.0–0.1)
Basophils Relative: 0.8 % (ref 0.0–3.0)
Eosinophils Absolute: 0.1 10*3/uL (ref 0.0–0.7)
Eosinophils Relative: 2.3 % (ref 0.0–5.0)
HCT: 43.2 % (ref 36.0–46.0)
Hemoglobin: 14.4 g/dL (ref 12.0–15.0)
Lymphocytes Relative: 29.7 % (ref 12.0–46.0)
Lymphs Abs: 1.3 10*3/uL (ref 0.7–4.0)
MCHC: 33.2 g/dL (ref 30.0–36.0)
MCV: 91.9 fl (ref 78.0–100.0)
Monocytes Absolute: 0.5 10*3/uL (ref 0.1–1.0)
Monocytes Relative: 10.9 % (ref 3.0–12.0)
Neutro Abs: 2.5 10*3/uL (ref 1.4–7.7)
Neutrophils Relative %: 56.3 % (ref 43.0–77.0)
Platelets: 224 10*3/uL (ref 150.0–400.0)
RBC: 4.7 Mil/uL (ref 3.87–5.11)
RDW: 14 % (ref 11.5–15.5)
WBC: 4.5 10*3/uL (ref 4.0–10.5)

## 2021-12-17 LAB — HEPATIC FUNCTION PANEL
ALT: 23 U/L (ref 0–35)
AST: 19 U/L (ref 0–37)
Albumin: 4.2 g/dL (ref 3.5–5.2)
Alkaline Phosphatase: 82 U/L (ref 39–117)
Bilirubin, Direct: 0.1 mg/dL (ref 0.0–0.3)
Total Bilirubin: 0.6 mg/dL (ref 0.2–1.2)
Total Protein: 7.1 g/dL (ref 6.0–8.3)

## 2021-12-17 LAB — VITAMIN D 25 HYDROXY (VIT D DEFICIENCY, FRACTURES): VITD: 39.89 ng/mL (ref 30.00–100.00)

## 2021-12-17 LAB — TSH: TSH: 2.7 u[IU]/mL (ref 0.35–5.50)

## 2021-12-18 NOTE — Progress Notes (Signed)
Vit d in acceptable range . Low normal .  Blood sugar borderline but  no diabetes . Kidney function  normal and cholesterol in range  Continue lifestyle intervention healthy eating and exercise . (weight bearing (

## 2022-02-23 ENCOUNTER — Other Ambulatory Visit: Payer: Self-pay | Admitting: Internal Medicine

## 2022-02-23 DIAGNOSIS — Z1231 Encounter for screening mammogram for malignant neoplasm of breast: Secondary | ICD-10-CM

## 2022-03-17 DIAGNOSIS — Z411 Encounter for cosmetic surgery: Secondary | ICD-10-CM | POA: Diagnosis not present

## 2022-03-17 DIAGNOSIS — D2261 Melanocytic nevi of right upper limb, including shoulder: Secondary | ICD-10-CM | POA: Diagnosis not present

## 2022-03-17 DIAGNOSIS — Z86018 Personal history of other benign neoplasm: Secondary | ICD-10-CM | POA: Diagnosis not present

## 2022-03-17 DIAGNOSIS — D2372 Other benign neoplasm of skin of left lower limb, including hip: Secondary | ICD-10-CM | POA: Diagnosis not present

## 2022-03-17 DIAGNOSIS — D2272 Melanocytic nevi of left lower limb, including hip: Secondary | ICD-10-CM | POA: Diagnosis not present

## 2022-03-17 DIAGNOSIS — L821 Other seborrheic keratosis: Secondary | ICD-10-CM | POA: Diagnosis not present

## 2022-03-17 DIAGNOSIS — L578 Other skin changes due to chronic exposure to nonionizing radiation: Secondary | ICD-10-CM | POA: Diagnosis not present

## 2022-03-17 DIAGNOSIS — D485 Neoplasm of uncertain behavior of skin: Secondary | ICD-10-CM | POA: Diagnosis not present

## 2022-03-17 DIAGNOSIS — L719 Rosacea, unspecified: Secondary | ICD-10-CM | POA: Diagnosis not present

## 2022-03-17 DIAGNOSIS — L570 Actinic keratosis: Secondary | ICD-10-CM | POA: Diagnosis not present

## 2022-03-17 DIAGNOSIS — D4989 Neoplasm of unspecified behavior of other specified sites: Secondary | ICD-10-CM | POA: Diagnosis not present

## 2022-03-17 DIAGNOSIS — D225 Melanocytic nevi of trunk: Secondary | ICD-10-CM | POA: Diagnosis not present

## 2022-04-06 DIAGNOSIS — D0372 Melanoma in situ of left lower limb, including hip: Secondary | ICD-10-CM | POA: Diagnosis not present

## 2022-04-06 DIAGNOSIS — L905 Scar conditions and fibrosis of skin: Secondary | ICD-10-CM | POA: Diagnosis not present

## 2022-04-19 ENCOUNTER — Ambulatory Visit
Admission: RE | Admit: 2022-04-19 | Discharge: 2022-04-19 | Disposition: A | Discharge status: Home / Self Care | Payer: PPO | Source: Ambulatory Visit | Attending: Internal Medicine | Admitting: Internal Medicine

## 2022-04-19 DIAGNOSIS — Z1231 Encounter for screening mammogram for malignant neoplasm of breast: Secondary | ICD-10-CM

## 2022-05-06 DIAGNOSIS — Z5189 Encounter for other specified aftercare: Secondary | ICD-10-CM | POA: Diagnosis not present

## 2022-05-06 DIAGNOSIS — Z86006 Personal history of melanoma in-situ: Secondary | ICD-10-CM | POA: Diagnosis not present

## 2022-06-08 ENCOUNTER — Telehealth: Payer: Self-pay | Admitting: Internal Medicine

## 2022-06-08 NOTE — Telephone Encounter (Signed)
Called patient to schedule Medicare Annual Wellness Visit (AWV). No voicemail available to leave a message.  Last date of AWV: 01/09/20  Please schedule an appointment at any time with [facility] or hannah kim.  If any questions, please contact me at [phone].  Thank you ,  [name] AWV direct phone # [phone]

## 2022-06-08 NOTE — Telephone Encounter (Signed)
Contacted Cynthia Miller to schedule their annual wellness visit. Patient declined to schedule AWV at this time.  Do not call   [name] AWV direct phone # [phone]   Patient returned my call   stating she does not need this appointment and please take her off the list to call

## 2022-06-16 DIAGNOSIS — Z5189 Encounter for other specified aftercare: Secondary | ICD-10-CM | POA: Diagnosis not present

## 2022-06-16 DIAGNOSIS — Z86006 Personal history of melanoma in-situ: Secondary | ICD-10-CM | POA: Diagnosis not present

## 2022-10-12 DIAGNOSIS — Z86018 Personal history of other benign neoplasm: Secondary | ICD-10-CM | POA: Diagnosis not present

## 2022-10-12 DIAGNOSIS — L570 Actinic keratosis: Secondary | ICD-10-CM | POA: Diagnosis not present

## 2022-10-12 DIAGNOSIS — D2372 Other benign neoplasm of skin of left lower limb, including hip: Secondary | ICD-10-CM | POA: Diagnosis not present

## 2022-10-12 DIAGNOSIS — L821 Other seborrheic keratosis: Secondary | ICD-10-CM | POA: Diagnosis not present

## 2022-10-12 DIAGNOSIS — D225 Melanocytic nevi of trunk: Secondary | ICD-10-CM | POA: Diagnosis not present

## 2022-10-12 DIAGNOSIS — D2261 Melanocytic nevi of right upper limb, including shoulder: Secondary | ICD-10-CM | POA: Diagnosis not present

## 2022-10-12 DIAGNOSIS — L719 Rosacea, unspecified: Secondary | ICD-10-CM | POA: Diagnosis not present

## 2022-10-12 DIAGNOSIS — D2272 Melanocytic nevi of left lower limb, including hip: Secondary | ICD-10-CM | POA: Diagnosis not present

## 2022-10-12 DIAGNOSIS — L578 Other skin changes due to chronic exposure to nonionizing radiation: Secondary | ICD-10-CM | POA: Diagnosis not present

## 2022-11-19 DIAGNOSIS — H2513 Age-related nuclear cataract, bilateral: Secondary | ICD-10-CM | POA: Diagnosis not present

## 2022-12-14 ENCOUNTER — Other Ambulatory Visit: Payer: Self-pay | Admitting: Internal Medicine

## 2022-12-14 DIAGNOSIS — Z1231 Encounter for screening mammogram for malignant neoplasm of breast: Secondary | ICD-10-CM

## 2022-12-20 ENCOUNTER — Encounter: Payer: PPO | Admitting: Internal Medicine

## 2022-12-21 NOTE — Progress Notes (Signed)
No chief complaint on file.   HPI: Patient  Cynthia Miller  77 y.o. comes in today for Preventive Health Care visit   Derm : Health Maintenance  Topic Date Due   Medicare Annual Wellness (AWV)  01/08/2021   INFLUENZA VACCINE  09/23/2022   COVID-19 Vaccine (5 - 2023-24 season) 10/24/2022   MAMMOGRAM  04/20/2023   DTaP/Tdap/Td (3 - Td or Tdap) 10/08/2028   Pneumonia Vaccine 59+ Years old  Completed   DEXA SCAN  Completed   Hepatitis C Screening  Completed   Zoster Vaccines- Shingrix  Completed   HPV VACCINES  Aged Out   Colonoscopy  Discontinued   Health Maintenance Review LIFESTYLE:  Exercise:   Tobacco/ETS: Alcohol:  Sugar beverages: Sleep: Drug use: no HH of  Work: retired Geographical information systems officer     ROS:  GEN/ HEENT: No fever, significant weight changes sweats headaches vision problems hearing changes, CV/ PULM; No chest pain shortness of breath cough, syncope,edema  change in exercise tolerance. GI /GU: No adominal pain, vomiting, change in bowel habits. No blood in the stool. No significant GU symptoms. SKIN/HEME: ,no acute skin rashes suspicious lesions or bleeding. No lymphadenopathy, nodules, masses.  NEURO/ PSYCH:  No neurologic signs such as weakness numbness. No depression anxiety. IMM/ Allergy: No unusual infections.  Allergy .   REST of 12 system review negative except as per HPI   Past Medical History:  Diagnosis Date   ALLERGIC RHINITIS    seasonal   Allergy    Cataract    forming   CIN I (cervical intraepithelial neoplasia I)    FIBROCYSTIC BREAST DISEASE    Hx of varicella    as child   Kidney stones 07/08/2016   Low blood pressure    Osteoarthritis    THUMB   Osteopenia    left  hip   Tinnitus of both ears    L>R, follows with ENT for same- past hx    Past Surgical History:  Procedure Laterality Date   BREAST BIOPSY Left    BREAST CYST ASPIRATION     BREAST CYST EXCISION Left    CERVICAL CONE BIOPSY     COLONOSCOPY     x2- one  brodie, one perry   COLPOSCOPY     LAPAROSCOPIC TUBAL LIGATION     RIGHT FOOT     bednarz- bunionctomy    Family History  Problem Relation Age of Onset   Hyperlipidemia Mother        died age 96   Thyroid disease Mother    Diverticulosis Father    Lung cancer Father        brain mets,did smoke   Healthy Brother    Breast cancer Paternal Grandmother    Colon cancer Neg Hx    Colon polyps Neg Hx    Esophageal cancer Neg Hx    Rectal cancer Neg Hx    Stomach cancer Neg Hx     Social History   Socioeconomic History   Marital status: Married    Spouse name: Not on file   Number of children: 1   Years of education: 16   Highest education level: Not on file  Occupational History   Occupation: Systems analyst: RETIRED  Tobacco Use   Smoking status: Never   Smokeless tobacco: Never  Vaping Use   Vaping status: Never Used  Substance and Sexual Activity   Alcohol use: Yes    Alcohol/week: 14.0 standard drinks of alcohol  Types: 14 Glasses of wine per week   Drug use: No   Sexual activity: Yes    Partners: Male    Birth control/protection: Surgical  Other Topics Concern   Not on file  Social History Narrative   HSG, [employer] - [employer] in [place] to finish BA Art history. Married 1969 - 10 yr/divorce. Remarried '83 - 10 yrs/divorced. Remarried '97 -. 1 dtr - '74. 2 grandchildren. Work - volunteering. Marriage in good health.    Social Determinants of Health   Financial Resource Strain: Low Risk  (01/09/2020)   Overall Financial Resource Strain (CARDIA)    Difficulty of Paying Living Expenses: Not hard at all  Food Insecurity: No Food Insecurity (01/09/2020)   Hunger Vital Sign    Worried About Running Out of Food in the Last Year: Never true    Ran Out of Food in the Last Year: Never true  Transportation Needs: No Transportation Needs (01/09/2020)   PRAPARE - Administrator, Civil Service (Medical): No    Lack of Transportation  (Non-Medical): No  Physical Activity: Sufficiently Active (01/09/2020)   Exercise Vital Sign    Days of Exercise per Week: 7 days    Minutes of Exercise per Session: 60 min  Stress: No Stress Concern Present (01/09/2020)   [employer] of Occupational Health - Occupational Stress Questionnaire    Feeling of Stress : Not at all  Social Connections: Moderately Isolated (01/09/2020)   Social Connection and Isolation Panel [NHANES]    Frequency of Communication with Friends and Family: More than three times a week    Frequency of Social Gatherings with Friends and Family: More than three times a week    Attends Religious Services: Never    Database administrator or Organizations: No    Attends Banker Meetings: Never    Marital Status: Married    Outpatient Medications Prior to Visit  Medication Sig Dispense Refill   COVID-19 mRNA Vac-TriS, Pfizer, (PFIZER-BIONT COVID-19 VAC-TRIS) SUSP injection Inject into the muscle. 0.3 mL 0   Misc Natural Products (ELDERBERRY IMMUNE COMPLEX) CHEW Chew by mouth.     tretinoin (RETIN-A) 0.05 % cream SMARTSIG:Topical Every Evening     No facility-administered medications prior to visit.     EXAM:  There were no vitals taken for this visit.  There is no height or weight on file to calculate BMI. Wt Readings from Last 3 Encounters:  12/16/21 133 lb 9.6 oz (60.6 kg)  12/25/20 135 lb (61.2 kg)  12/11/20 135 lb (61.2 kg)    Physical Exam: Vital signs reviewed ZOX:WRUE is a well-developed well-nourished alert cooperative    who appearsr stated age in no acute distress.  HEENT: normocephalic atraumatic , Eyes: PERRL EOM's full, conjunctiva clear, Nares: paten,t no deformity discharge or tenderness., Ears: no deformity EAC's clear TMs with normal landmarks. Mouth: clear OP, no lesions, edema.  Moist mucous membranes. Dentition in adequate repair. NECK: supple without masses, thyromegaly or bruits. CHEST/PULM:  Clear to auscultation  and percussion breath sounds equal no wheeze , rales or rhonchi. No chest wall deformities or tenderness. Breast: normal by inspection . No dimpling, discharge, masses, tenderness or discharge . CV: PMI is nondisplaced, S1 S2 no gallops, murmurs, rubs. Peripheral pulses are full without delay.No JVD .  ABDOMEN: Bowel sounds normal nontender  No guard or rebound, no hepato splenomegal no CVA tenderness.  No hernia. Extremtities:  No clubbing cyanosis or edema, no acute joint swelling or  redness no focal atrophy NEURO:  Oriented x3, cranial nerves 3-12 appear to be intact, no obvious focal weakness,gait within normal limits no abnormal reflexes or asymmetrical SKIN: No acute rashes normal turgor, color, no bruising or petechiae. PSYCH: Oriented, good eye contact, no obvious depression anxiety, cognition and judgment appear normal. LN: no cervical axillary adenopathy  Lab Results  Component Value Date   WBC 4.5 12/17/2021   HGB 14.4 12/17/2021   HCT 43.2 12/17/2021   PLT 224.0 12/17/2021   GLUCOSE 101 (H) 12/17/2021   CHOL 166 12/17/2021   TRIG 36.0 12/17/2021   HDL 62.00 12/17/2021   LDLCALC 96 12/17/2021   ALT 23 12/17/2021   AST 19 12/17/2021   NA 139 12/17/2021   K 4.5 12/17/2021   CL 104 12/17/2021   CREATININE 0.76 12/17/2021   BUN 25 (H) 12/17/2021   CO2 29 12/17/2021   TSH 2.70 12/17/2021   HGBA1C 5.6 07/22/2017    BP Readings from Last 3 Encounters:  12/16/21 138/84  12/25/20 125/66  11/18/20 110/70    Lab plan  reviewed with patient   ASSESSMENT AND PLAN:  Discussed the following assessment and plan:    ICD-10-CM   1. Visit for preventive health examination  Z00.00     2. Osteopenia, unspecified location  M85.80     3. Medication management  Z79.899      No follow-ups on file.  Patient Care Team: Delayne Sanzo, Neta Mends, MD as PCP - General (Internal Medicine) Hilarie Fredrickson, MD (Gastroenterology) Elmon Else, MD (Dermatology) Enid Baas, MD (Sports  Medicine) Sidney Ace, MD (Allergy) Newman Pies, MD (Otolaryngology) Maris Berger, MD as Consulting Physician (Ophthalmology) There are no Patient Instructions on file for this visit.  Neta Mends. Chrisotpher Rivero M.D.

## 2022-12-22 ENCOUNTER — Ambulatory Visit: Payer: PPO | Admitting: Internal Medicine

## 2022-12-22 ENCOUNTER — Encounter: Payer: Self-pay | Admitting: Internal Medicine

## 2022-12-22 VITALS — BP 110/80 | HR 69 | Temp 98.1°F | Ht 61.8 in | Wt 134.4 lb

## 2022-12-22 DIAGNOSIS — Z23 Encounter for immunization: Secondary | ICD-10-CM

## 2022-12-22 DIAGNOSIS — Z79899 Other long term (current) drug therapy: Secondary | ICD-10-CM | POA: Diagnosis not present

## 2022-12-22 DIAGNOSIS — Z0000 Encounter for general adult medical examination without abnormal findings: Secondary | ICD-10-CM

## 2022-12-22 DIAGNOSIS — E2839 Other primary ovarian failure: Secondary | ICD-10-CM

## 2022-12-22 DIAGNOSIS — R739 Hyperglycemia, unspecified: Secondary | ICD-10-CM

## 2022-12-22 DIAGNOSIS — M8580 Other specified disorders of bone density and structure, unspecified site: Secondary | ICD-10-CM | POA: Diagnosis not present

## 2022-12-22 LAB — CBC WITH DIFFERENTIAL/PLATELET
Basophils Absolute: 0 10*3/uL (ref 0.0–0.1)
Basophils Relative: 0.4 % (ref 0.0–3.0)
Eosinophils Absolute: 0 10*3/uL (ref 0.0–0.7)
Eosinophils Relative: 0.9 % (ref 0.0–5.0)
HCT: 44.9 % (ref 36.0–46.0)
Hemoglobin: 14.6 g/dL (ref 12.0–15.0)
Lymphocytes Relative: 28.3 % (ref 12.0–46.0)
Lymphs Abs: 1.4 10*3/uL (ref 0.7–4.0)
MCHC: 32.5 g/dL (ref 30.0–36.0)
MCV: 93.3 fL (ref 78.0–100.0)
Monocytes Absolute: 0.4 10*3/uL (ref 0.1–1.0)
Monocytes Relative: 7.6 % (ref 3.0–12.0)
Neutro Abs: 3.2 10*3/uL (ref 1.4–7.7)
Neutrophils Relative %: 62.8 % (ref 43.0–77.0)
Platelets: 233 10*3/uL (ref 150.0–400.0)
RBC: 4.82 Mil/uL (ref 3.87–5.11)
RDW: 13.6 % (ref 11.5–15.5)
WBC: 5.1 10*3/uL (ref 4.0–10.5)

## 2022-12-22 LAB — COMPREHENSIVE METABOLIC PANEL
ALT: 33 U/L (ref 0–35)
AST: 26 U/L (ref 0–37)
Albumin: 4.3 g/dL (ref 3.5–5.2)
Alkaline Phosphatase: 94 U/L (ref 39–117)
BUN: 19 mg/dL (ref 6–23)
CO2: 28 meq/L (ref 19–32)
Calcium: 9.6 mg/dL (ref 8.4–10.5)
Chloride: 103 meq/L (ref 96–112)
Creatinine, Ser: 0.73 mg/dL (ref 0.40–1.20)
GFR: 79.12 mL/min (ref >60.00)
Glucose, Bld: 95 mg/dL (ref 70–99)
Potassium: 3.7 meq/L (ref 3.5–5.1)
Sodium: 139 meq/L (ref 135–145)
Total Bilirubin: 0.7 mg/dL (ref 0.2–1.2)
Total Protein: 7.4 g/dL (ref 6.0–8.3)

## 2022-12-22 LAB — VITAMIN D 25 HYDROXY (VIT D DEFICIENCY, FRACTURES): VITD: 34.21 ng/mL (ref 30.00–100.00)

## 2022-12-22 LAB — LIPID PANEL
Cholesterol: 168 mg/dL (ref 0–200)
HDL: 62 mg/dL (ref >39.00)
LDL Cholesterol: 96 mg/dL (ref 0–99)
NonHDL: 106.32
Total CHOL/HDL Ratio: 3
Triglycerides: 54 mg/dL (ref 0.0–149.0)
VLDL: 10.8 mg/dL (ref 0.0–40.0)

## 2022-12-22 LAB — TSH: TSH: 2.3 u[IU]/mL (ref 0.35–5.50)

## 2022-12-22 LAB — HEMOGLOBIN A1C: Hgb A1c MFr Bld: 5.6 % (ref 4.6–6.5)

## 2022-12-22 NOTE — Patient Instructions (Addendum)
Good to see you today  Normal exam  Continue lifestyle intervention healthy eating and exercise . Optimize resistance exercise for bone health .  Updating lab Ordering  bone density at [facility]  You need to make your own appt when convenient  Please call [phone] to schedule a Bone Density (DexaScan) a the [place] office at [address] [facility].    Continue lifestyle intervention healthy eating and exercise .

## 2022-12-22 NOTE — Progress Notes (Signed)
All labs in range  vit d ok stable

## 2023-01-04 ENCOUNTER — Ambulatory Visit (INDEPENDENT_AMBULATORY_CARE_PROVIDER_SITE_OTHER)
Admission: RE | Admit: 2023-01-04 | Discharge: 2023-01-04 | Disposition: A | Discharge status: Home / Self Care | Payer: PPO | Source: Ambulatory Visit | Attending: Internal Medicine | Admitting: Internal Medicine

## 2023-01-04 DIAGNOSIS — E2839 Other primary ovarian failure: Secondary | ICD-10-CM

## 2023-01-04 DIAGNOSIS — M8580 Other specified disorders of bone density and structure, unspecified site: Secondary | ICD-10-CM

## 2023-01-05 NOTE — Progress Notes (Signed)
Bone density low but not in osteoprosis range . Slightly down from last   Continue lifestyle intervention healthy eating and exercise .  Weight bearing  activity and resistance exercising.

## 2023-04-05 ENCOUNTER — Ambulatory Visit
Admission: RE | Admit: 2023-04-05 | Discharge: 2023-04-05 | Disposition: A | Discharge status: Home / Self Care | Payer: PPO | Source: Ambulatory Visit | Attending: Internal Medicine | Admitting: Internal Medicine

## 2023-04-05 DIAGNOSIS — Z1231 Encounter for screening mammogram for malignant neoplasm of breast: Secondary | ICD-10-CM

## 2023-04-29 DIAGNOSIS — Z86018 Personal history of other benign neoplasm: Secondary | ICD-10-CM | POA: Diagnosis not present

## 2023-04-29 DIAGNOSIS — D2372 Other benign neoplasm of skin of left lower limb, including hip: Secondary | ICD-10-CM | POA: Diagnosis not present

## 2023-04-29 DIAGNOSIS — L578 Other skin changes due to chronic exposure to nonionizing radiation: Secondary | ICD-10-CM | POA: Diagnosis not present

## 2023-04-29 DIAGNOSIS — D2272 Melanocytic nevi of left lower limb, including hip: Secondary | ICD-10-CM | POA: Diagnosis not present

## 2023-04-29 DIAGNOSIS — D225 Melanocytic nevi of trunk: Secondary | ICD-10-CM | POA: Diagnosis not present

## 2023-04-29 DIAGNOSIS — L821 Other seborrheic keratosis: Secondary | ICD-10-CM | POA: Diagnosis not present

## 2023-04-29 DIAGNOSIS — D2261 Melanocytic nevi of right upper limb, including shoulder: Secondary | ICD-10-CM | POA: Diagnosis not present

## 2023-06-01 ENCOUNTER — Telehealth: Payer: Self-pay

## 2023-06-01 NOTE — Telephone Encounter (Signed)
 Pt LVM that she needs an appt for Orthotics.  Please advise on when I can schedule

## 2023-06-02 ENCOUNTER — Ambulatory Visit: Admitting: Family Medicine

## 2023-06-02 VITALS — BP 128/57 | Ht 62.5 in | Wt 134.0 lb

## 2023-06-02 DIAGNOSIS — M2021 Hallux rigidus, right foot: Secondary | ICD-10-CM

## 2023-06-02 NOTE — Progress Notes (Addendum)
   PCP: Madelin Headings, MD  SUBJECTIVE:   HPI:  Patient is a 78 y.o. female here for replacement orthotics.   She has h/o hallux rigidus of the right 1st MTP and previous bunionectomy. She has done well with orthotics in the past though on her walks feels less supported in her current pair on her 4 mile walks, prompting today's visit. No new complaints.  Pertinent ROS were reviewed with the patient and found to be negative unless otherwise specified above in HPI.   PERTINENT  PMH / PSH / FH / SH:  Past Medical, Surgical, Social, and Family History Reviewed & Updated in the EMR.  Pertinent findings include:  See HPI  Allergies  Allergen Reactions   Penicillins     REACTION: Hives    OBJECTIVE:  BP (!) 128/57   Ht 5' 2.5" (1.588 m)   Wt 134 lb (60.8 kg)   BMI 24.12 kg/m   PHYSICAL EXAM:  GEN: Alert and Oriented, NAD, comfortable in exam room RESP: Unlabored respirations, symmetric chest rise PSY: normal mood, congruent affect   Bilateral Foot and Ankle Exam: No visible erythema or swelling. Mild long arch loss with navicular drop, L>R. Transverse arch collapse as well. Limited R great toe extension to 10d, remainder of foot and ankle ROM full. Strength is 5/5 in all directions NTTP NVI distally Gait normal  Assessment & Plan Hallux rigidus of right foot Done well with custom orthotics - new pair made today as below. F/u PRN.  Patient was fitted for a: standard, cushioned, semi-rigid orthotic. The orthotic was heated and afterward the patient stood on the orthotic blank positioned on the orthotic stand. The patient was positioned in subtalar neutral position and 10 degrees of ankle dorsiflexion in a weight bearing stance on the heated orthotic blank. After completion of molding, a stable base was applied to the orthotic blank. The blank was ground to a stable position for weight bearing. Size: 7 Base: Fit&Run Posting: 1st ray bilaterally Additional orthotic padding:  none  Orthotics were comfortable and patient had more neutral gait in the office today.   Glean Salen, MD PGY-4, Sports Medicine Fellow [facility]

## 2023-06-02 NOTE — Patient Instructions (Signed)
 Thank you for coming to see me today. It was a pleasure.   We made you new orthotics. Let us know if we need to add any extra cushioning or make changes.  If you have any questions or concerns, please do not hesitate to call the office at [phone].

## 2023-08-04 DIAGNOSIS — H0012 Chalazion right lower eyelid: Secondary | ICD-10-CM | POA: Diagnosis not present

## 2023-11-18 ENCOUNTER — Other Ambulatory Visit (HOSPITAL_BASED_OUTPATIENT_CLINIC_OR_DEPARTMENT_OTHER): Payer: Self-pay

## 2023-11-18 MED ORDER — COMIRNATY 30 MCG/0.3ML IM SUSY
0.3000 mL | PREFILLED_SYRINGE | Freq: Once | INTRAMUSCULAR | 0 refills | Status: AC
  Filled 2023-11-18: qty 0.3, 1d supply, fill #0

## 2023-11-22 DIAGNOSIS — H0100A Unspecified blepharitis right eye, upper and lower eyelids: Secondary | ICD-10-CM | POA: Diagnosis not present

## 2023-11-22 DIAGNOSIS — H2513 Age-related nuclear cataract, bilateral: Secondary | ICD-10-CM | POA: Diagnosis not present

## 2023-11-22 DIAGNOSIS — H0100B Unspecified blepharitis left eye, upper and lower eyelids: Secondary | ICD-10-CM | POA: Diagnosis not present

## 2023-12-09 ENCOUNTER — Other Ambulatory Visit (HOSPITAL_BASED_OUTPATIENT_CLINIC_OR_DEPARTMENT_OTHER): Payer: Self-pay

## 2023-12-09 MED ORDER — FLUZONE HIGH-DOSE 0.5 ML IM SUSY
0.5000 mL | PREFILLED_SYRINGE | Freq: Once | INTRAMUSCULAR | 0 refills | Status: AC
  Filled 2023-12-09: qty 0.5, 1d supply, fill #0

## 2023-12-27 ENCOUNTER — Ambulatory Visit: Payer: PPO | Admitting: Internal Medicine

## 2023-12-27 ENCOUNTER — Encounter: Payer: Self-pay | Admitting: Internal Medicine

## 2023-12-27 VITALS — BP 124/72 | HR 59 | Temp 97.4°F | Ht 61.77 in | Wt 136.8 lb

## 2023-12-27 DIAGNOSIS — Z79899 Other long term (current) drug therapy: Secondary | ICD-10-CM | POA: Diagnosis not present

## 2023-12-27 DIAGNOSIS — E559 Vitamin D deficiency, unspecified: Secondary | ICD-10-CM

## 2023-12-27 DIAGNOSIS — R739 Hyperglycemia, unspecified: Secondary | ICD-10-CM

## 2023-12-27 DIAGNOSIS — M8580 Other specified disorders of bone density and structure, unspecified site: Secondary | ICD-10-CM | POA: Diagnosis not present

## 2023-12-27 DIAGNOSIS — Z0000 Encounter for general adult medical examination without abnormal findings: Secondary | ICD-10-CM | POA: Diagnosis not present

## 2023-12-27 NOTE — Patient Instructions (Addendum)
 Good to see  you  today . Plan lab appt for fasting lab with vit D etc after another .month or so .  Continue healthy life style  high quality  protein . Reisitance exercises .  Yearly check or as needed

## 2023-12-27 NOTE — Progress Notes (Signed)
 Chief Complaint  Patient presents with   Annual Exam    HPI: Patient  Cynthia Miller  78 y.o. comes in today for Preventive Health Care visit  Under derm care  and eye care  Derm screening  regular  Has recurrent styes and advised to add vit d and omega supp  by Dr Alexia  Now in 1000 I u and 1 of omega supplement  for past month  Generally well  no change in health   Health Maintenance  Topic Date Due   Medicare Annual Wellness (AWV)  01/08/2021   Mammogram  04/04/2024   COVID-19 Vaccine (8 - Pfizer risk 2025-26 season) 05/17/2024   DTaP/Tdap/Td (3 - Td or Tdap) 10/08/2028   Pneumococcal Vaccine: 50+ Years  Completed   Influenza Vaccine  Completed   DEXA SCAN  Completed   Hepatitis C Screening  Completed   Zoster Vaccines- Shingrix  Completed   Meningococcal B Vaccine  Aged Out   Colonoscopy  Discontinued   Health Maintenance Review LIFESTYLE:  Exercise:   still walking . Free weight s Tobacco/ETS: N Alcohol:   no rare Sugar beverages: n Sleep: about 6 - 7  Drug use: no HH of   2   Protein:   attention to  protein.      ROS:  GEN/ HEENT: No fever, significant weight changes sweats headaches vision problems hearing changes, CV/ PULM; No chest pain shortness of breath cough, syncope,edema  change in exercise tolerance. GI /GU: No adominal pain, vomiting, change in bowel habits. No blood in the stool. No significant GU symptoms. SKIN/HEME: ,no acute skin rashes suspicious lesions or bleeding. No lymphadenopathy, nodules, masses.  NEURO/ PSYCH:  No neurologic signs such as weakness numbness. No depression anxiety. IMM/ Allergy: No unusual infections.  Allergy .   REST of 12 system review negative except as per HPI   Past Medical History:  Diagnosis Date   ALLERGIC RHINITIS    seasonal   Allergy    Cataract    forming   CIN I (cervical intraepithelial neoplasia I)    FIBROCYSTIC BREAST DISEASE    Hx of varicella    as child   Kidney stones 07/08/2016    Low blood pressure    Osteoarthritis    THUMB   Osteopenia    left  hip   Tinnitus of both ears    L>R, follows with ENT for same- past hx    Past Surgical History:  Procedure Laterality Date   BREAST BIOPSY Left    BREAST CYST ASPIRATION     BREAST CYST EXCISION Left    CERVICAL CONE BIOPSY     COLONOSCOPY     x2- one brodie, one perry   COLPOSCOPY     LAPAROSCOPIC TUBAL LIGATION     RIGHT FOOT     bednarz- bunionctomy    Family History  Problem Relation Age of Onset   Hyperlipidemia Mother        died age 5   Thyroid  disease Mother    Diverticulosis Father    Lung cancer Father        brain mets,did smoke   Healthy Brother    Breast cancer Paternal Grandmother    Colon cancer Neg Hx    Colon polyps Neg Hx    Esophageal cancer Neg Hx    Rectal cancer Neg Hx    Stomach cancer Neg Hx     Social History   Socioeconomic History   Marital  status: Married    Spouse name: Not on file   Number of children: 1   Years of education: 16   Highest education level: Not on file  Occupational History   Occupation: Systems Analyst: RETIRED  Tobacco Use   Smoking status: Never   Smokeless tobacco: Never  Vaping Use   Vaping status: Never Used  Substance and Sexual Activity   Alcohol use: Yes    Alcohol/week: 14.0 standard drinks of alcohol    Types: 14 Glasses of wine per week   Drug use: No   Sexual activity: Yes    Partners: Male    Birth control/protection: Surgical  Other Topics Concern   Not on file  Social History Narrative   HS[employer], [employer] - [place] in [place] to finish BA Art history. Married 1969 - 10 yr/divorce. Remarried '83 - 10 yrs/divorced. Remarried '97 -. 1 dtr - '74. 2 grandchildren. Work - volunteering. Marriage in good health.    Social Drivers of Corporate Investment Banker Strain: Low Risk  (12/22/2022)   Overall Financial Resource Strain (CARDIA)    Difficulty of Paying Living Expenses: Not hard at all  Food  Insecurity: No Food Insecurity (01/09/2020)   Hunger Vital Sign    Worried About Running Out of Food in the Last Year: Never true    Ran Out of Food in the Last Year: Never true  Transportation Needs: No Transportation Needs (01/09/2020)   PRAPARE - Administrator, Civil Service (Medical): No    Lack of Transportation (Non-Medical): No  Physical Activity: Sufficiently Active (12/22/2022)   Exercise Vital Sign    Days of Exercise per Week: 5 days    Minutes of Exercise per Session: 60 min  Stress: No Stress Concern Present (12/22/2022)   [employer] of Occupational Health - Occupational Stress Questionnaire    Feeling of Stress : Not at all  Social Connections: Moderately Isolated (12/22/2022)   Social Connection and Isolation Panel    Frequency of Communication with Friends and Family: More than three times a week    Frequency of Social Gatherings with Friends and Family: More than three times a week    Attends Religious Services: Never    Database Administrator or Organizations: No    Attends Banker Meetings: Never    Marital Status: Married    Outpatient Medications Prior to Visit  Medication Sig Dispense Refill   Omega-3 Fatty Acids (OMEGA 3 PO) Take 640 mg by mouth daily.     tretinoin (RETIN-A) 0.05 % cream SMARTSIG:Topical Every Evening     VITAMIN D  PO Take 1,000 Units by mouth daily.     Misc Natural Products (ELDERBERRY IMMUNE COMPLEX) CHEW Chew by mouth. (Patient not taking: Reported on 12/27/2023)     No facility-administered medications prior to visit.     EXAM:  BP 124/72 (BP Location: Left Arm, Patient Position: Sitting, Cuff Size: Normal)   Pulse (!) 59   Temp (!) 97.4 F (36.3 C) (Oral)   Ht 5' 1.77 (1.569 m)   Wt 136 lb 12.8 oz (62.1 kg)   SpO2 99%   BMI 25.21 kg/m   Body mass index is 25.21 kg/m. Wt Readings from Last 3 Encounters:  12/27/23 136 lb 12.8 oz (62.1 kg)  06/02/23 134 lb (60.8 kg)  12/22/22 134 lb 6.4 oz  (61 kg)    Physical Exam: Vital signs reviewed HZW:Uypd is a well-developed well-nourished alert cooperative  who appearsr stated age in no acute distress.  HEENT: normocephalic atraumatic , Eyes: PERRL EOM's full, conjunctiva clear, Nares: paten,t no deformity discharge or tenderness., Ears: no deformity EAC's clear TMs with normal landmarks. Mouth: clear OP, no lesions, edema.  Moist mucous membranes. Dentition in adequate repair. NECK: supple without masses, thyromegaly or bruits. CHEST/PULM:  Clear to auscultation and percussion breath sounds equal no wheeze , rales or rhonchi. No chest wall deformities or tenderness. Breast: normal by inspection . No dimpling, discharge, masses, tenderness or discharge . CV: PMI is nondisplaced, S1 S2 no gallops, murmurs, rubs. Peripheral pulses are full without delay.No JVD .  ABDOMEN: Bowel sounds normal nontender  No guard or rebound, no hepato splenomegal no CVA tenderness.  Extremtities:  No clubbing cyanosis or edema, no acute joint swelling or redness no focal atrophy NEURO:  Oriented x3, cranial nerves 3-12 appear to be intact, no obvious focal weakness,gait within normal limits no abnormal reflexes or asymmetrical SKIN: No acute rashes normal turgor, color, no bruising or petechiae. PSYCH: Oriented, good eye contact, no obvious depression anxiety, cognition and judgment appear normal. LN: no cervical axillary  adenopathy  Lab Results  Component Value Date   WBC 5.1 12/22/2022   HGB 14.6 12/22/2022   HCT 44.9 12/22/2022   PLT 233.0 12/22/2022   GLUCOSE 95 12/22/2022   CHOL 168 12/22/2022   TRIG 54.0 12/22/2022   HDL 62.00 12/22/2022   LDLCALC 96 12/22/2022   ALT 33 12/22/2022   AST 26 12/22/2022   NA 139 12/22/2022   K 3.7 12/22/2022   CL 103 12/22/2022   CREATININE 0.73 12/22/2022   BUN 19 12/22/2022   CO2 28 12/22/2022   TSH 2.30 12/22/2022   HGBA1C 5.6 12/22/2022    BP Readings from Last 3 Encounters:  12/27/23 124/72   06/02/23 (!) 128/57  12/22/22 110/80    Lab plan reviewed with patient   ASSESSMENT AND PLAN:  Discussed the following assessment and plan:    ICD-10-CM   1. Visit for preventive health examination  Z00.00     2. Medication management  Z79.899 Basic metabolic panel with GFR    CBC with Differential/Platelet    Hemoglobin A1c    Hepatic function panel    Lipid panel    TSH    VITAMIN D  25 Hydroxy (Vit-D Deficiency, Fractures)    3. Osteopenia, unspecified location  M85.80 Basic metabolic panel with GFR    CBC with Differential/Platelet    Hemoglobin A1c    Hepatic function panel    Lipid panel    TSH    VITAMIN D  25 Hydroxy (Vit-D Deficiency, Fractures)    4. Vitamin D  deficiency  E55.9 Basic metabolic panel with GFR    CBC with Differential/Platelet    Hemoglobin A1c    Hepatic function panel    Lipid panel    TSH    VITAMIN D  25 Hydroxy (Vit-D Deficiency, Fractures)    5. Hyperglycemia  R73.9 Basic metabolic panel with GFR    CBC with Differential/Platelet    Hemoglobin A1c    Hepatic function panel    Lipid panel    TSH    VITAMIN D  25 Hydroxy (Vit-D Deficiency, Fractures)    Plan future lab  after being on  supplement for 2-3 months  Return for lab in 2+ months  and yearly .  Patient Care Team: Jiles Goya K, MD as PCP - General (Internal Medicine) Abran Norleen SAILOR, MD (Gastroenterology) Robinson Pao, MD (Dermatology)  Harvey Seltzer, MD (Sports Medicine) Frutoso Luz, MD (Allergy) Karis Clunes, MD (Otolaryngology) Leslee Reusing, MD as Consulting Physician (Ophthalmology) Patient Instructions  Good to see  you  today . Plan lab appt for fasting lab with vit D etc after another .month or so .  Continue healthy life style  high quality  protein . Reisitance exercises .  Yearly check or as needed  Ashyah Quizon K. Shirl Weir M.D.

## 2024-03-09 ENCOUNTER — Other Ambulatory Visit: Payer: Self-pay | Admitting: Internal Medicine

## 2024-03-09 DIAGNOSIS — Z1231 Encounter for screening mammogram for malignant neoplasm of breast: Secondary | ICD-10-CM

## 2024-03-20 ENCOUNTER — Other Ambulatory Visit

## 2024-03-28 ENCOUNTER — Other Ambulatory Visit

## 2024-03-28 DIAGNOSIS — M8580 Other specified disorders of bone density and structure, unspecified site: Secondary | ICD-10-CM

## 2024-03-28 DIAGNOSIS — R739 Hyperglycemia, unspecified: Secondary | ICD-10-CM

## 2024-03-28 DIAGNOSIS — Z79899 Other long term (current) drug therapy: Secondary | ICD-10-CM

## 2024-03-28 DIAGNOSIS — E559 Vitamin D deficiency, unspecified: Secondary | ICD-10-CM

## 2024-03-28 LAB — CBC WITH DIFFERENTIAL/PLATELET
Basophils Absolute: 0 10*3/uL (ref 0.0–0.1)
Basophils Relative: 0.6 % (ref 0.0–3.0)
Eosinophils Absolute: 0.1 10*3/uL (ref 0.0–0.7)
Eosinophils Relative: 1.9 % (ref 0.0–5.0)
HCT: 44.2 % (ref 36.0–46.0)
Hemoglobin: 14.6 g/dL (ref 12.0–15.0)
Lymphocytes Relative: 30.9 % (ref 12.0–46.0)
Lymphs Abs: 1.4 10*3/uL (ref 0.7–4.0)
MCHC: 33.1 g/dL (ref 30.0–36.0)
MCV: 92.5 fl (ref 78.0–100.0)
Monocytes Absolute: 0.5 10*3/uL (ref 0.1–1.0)
Monocytes Relative: 10.2 % (ref 3.0–12.0)
Neutro Abs: 2.6 10*3/uL (ref 1.4–7.7)
Neutrophils Relative %: 56.4 % (ref 43.0–77.0)
Platelets: 221 10*3/uL (ref 150.0–400.0)
RBC: 4.78 Mil/uL (ref 3.87–5.11)
RDW: 14.1 % (ref 11.5–15.5)
WBC: 4.7 10*3/uL (ref 4.0–10.5)

## 2024-03-28 LAB — HEMOGLOBIN A1C: Hgb A1c MFr Bld: 5.8 % (ref 4.6–6.5)

## 2024-03-28 LAB — LIPID PANEL
Cholesterol: 184 mg/dL (ref 28–200)
HDL: 65.5 mg/dL
LDL Cholesterol: 111 mg/dL — ABNORMAL HIGH (ref 10–99)
NonHDL: 118.63
Total CHOL/HDL Ratio: 3
Triglycerides: 40 mg/dL (ref 10.0–149.0)
VLDL: 8 mg/dL (ref 0.0–40.0)

## 2024-03-28 LAB — HEPATIC FUNCTION PANEL
ALT: 21 U/L (ref 3–35)
AST: 22 U/L (ref 5–37)
Albumin: 4.2 g/dL (ref 3.5–5.2)
Alkaline Phosphatase: 85 U/L (ref 39–117)
Bilirubin, Direct: 0 mg/dL — ABNORMAL LOW (ref 0.1–0.3)
Total Bilirubin: 0.4 mg/dL (ref 0.2–1.2)
Total Protein: 7.1 g/dL (ref 6.0–8.3)

## 2024-03-28 LAB — BASIC METABOLIC PANEL WITH GFR
BUN: 20 mg/dL (ref 6–23)
CO2: 29 meq/L (ref 19–32)
Calcium: 9.4 mg/dL (ref 8.4–10.5)
Chloride: 105 meq/L (ref 96–112)
Creatinine, Ser: 0.66 mg/dL (ref 0.40–1.20)
GFR: 83.64 mL/min
Glucose, Bld: 98 mg/dL (ref 70–99)
Potassium: 3.8 meq/L (ref 3.5–5.1)
Sodium: 141 meq/L (ref 135–145)

## 2024-03-28 LAB — TSH: TSH: 3.62 u[IU]/mL (ref 0.35–5.50)

## 2024-03-28 LAB — VITAMIN D 25 HYDROXY (VIT D DEFICIENCY, FRACTURES): VITD: 27.96 ng/mL — ABNORMAL LOW (ref 30.00–100.00)

## 2024-03-29 ENCOUNTER — Ambulatory Visit: Payer: Self-pay | Admitting: Internal Medicine

## 2024-03-29 ENCOUNTER — Other Ambulatory Visit

## 2024-03-29 NOTE — Progress Notes (Signed)
 Ldl cholesterol up some for her  but  continue attention to healrhy life habits  Vit d level slightly low  and continue supplementation   ( sometimes may take  2-3 months on a given dose to see effect  as is lipid  soluble vitamin  )  may be 1000- 2000 u per day Flag on liver test is not significant

## 2024-04-10 ENCOUNTER — Ambulatory Visit
